# Patient Record
Sex: Female | Born: 1940 | Race: White | Hispanic: No | Smoking: Never smoker
Health system: Southern US, Community
[De-identification: ages and names within clinical notes are randomized; demographics above are authoritative.]

## PROBLEM LIST (undated history)

## (undated) DIAGNOSIS — M1990 Unspecified osteoarthritis, unspecified site: Secondary | ICD-10-CM

## (undated) DIAGNOSIS — H3530 Unspecified macular degeneration: Secondary | ICD-10-CM

## (undated) DIAGNOSIS — E7800 Pure hypercholesterolemia, unspecified: Secondary | ICD-10-CM

## (undated) DIAGNOSIS — E119 Type 2 diabetes mellitus without complications: Secondary | ICD-10-CM

## (undated) DIAGNOSIS — H9190 Unspecified hearing loss, unspecified ear: Secondary | ICD-10-CM

## (undated) DIAGNOSIS — I10 Essential (primary) hypertension: Secondary | ICD-10-CM

## (undated) HISTORY — PX: ORIF ANKLE FRACTURE: SUR919

## (undated) HISTORY — PX: PATELLA FRACTURE SURGERY: SHX735

---

## 2000-12-15 ENCOUNTER — Ambulatory Visit (HOSPITAL_COMMUNITY): Admission: RE | Admit: 2000-12-15 | Discharge: 2000-12-15 | Payer: Self-pay | Admitting: Internal Medicine

## 2006-06-06 ENCOUNTER — Inpatient Hospital Stay (HOSPITAL_COMMUNITY): Admission: EM | Admit: 2006-06-06 | Discharge: 2006-06-11 | Payer: Self-pay | Admitting: Emergency Medicine

## 2006-06-06 ENCOUNTER — Ambulatory Visit: Payer: Self-pay | Admitting: Orthopedic Surgery

## 2006-06-08 ENCOUNTER — Encounter: Payer: Self-pay | Admitting: Orthopedic Surgery

## 2006-06-21 ENCOUNTER — Ambulatory Visit: Payer: Self-pay | Admitting: Orthopedic Surgery

## 2006-07-19 ENCOUNTER — Ambulatory Visit: Payer: Self-pay | Admitting: Orthopedic Surgery

## 2006-08-09 ENCOUNTER — Ambulatory Visit: Payer: Self-pay | Admitting: Orthopedic Surgery

## 2006-08-11 ENCOUNTER — Encounter (HOSPITAL_COMMUNITY): Admission: RE | Admit: 2006-08-11 | Discharge: 2006-09-10 | Payer: Self-pay | Admitting: Orthopedic Surgery

## 2006-09-09 ENCOUNTER — Ambulatory Visit: Payer: Self-pay | Admitting: Orthopedic Surgery

## 2006-09-14 ENCOUNTER — Encounter (HOSPITAL_COMMUNITY): Admission: RE | Admit: 2006-09-14 | Discharge: 2006-10-12 | Payer: Self-pay | Admitting: Orthopedic Surgery

## 2008-05-09 ENCOUNTER — Emergency Department (HOSPITAL_COMMUNITY): Admission: EM | Admit: 2008-05-09 | Discharge: 2008-05-09 | Payer: Self-pay | Admitting: Dentistry

## 2008-05-09 ENCOUNTER — Encounter: Payer: Self-pay | Admitting: Orthopedic Surgery

## 2008-05-09 DIAGNOSIS — E119 Type 2 diabetes mellitus without complications: Secondary | ICD-10-CM | POA: Insufficient documentation

## 2008-05-10 ENCOUNTER — Ambulatory Visit: Payer: Self-pay | Admitting: Orthopedic Surgery

## 2008-05-10 DIAGNOSIS — S82009A Unspecified fracture of unspecified patella, initial encounter for closed fracture: Secondary | ICD-10-CM | POA: Insufficient documentation

## 2008-05-15 ENCOUNTER — Ambulatory Visit: Payer: Self-pay | Admitting: Orthopedic Surgery

## 2008-05-15 ENCOUNTER — Ambulatory Visit (HOSPITAL_COMMUNITY): Admission: RE | Admit: 2008-05-15 | Discharge: 2008-05-15 | Payer: Self-pay | Admitting: Orthopedic Surgery

## 2008-05-16 ENCOUNTER — Encounter: Payer: Self-pay | Admitting: Orthopedic Surgery

## 2008-05-16 ENCOUNTER — Telehealth: Payer: Self-pay | Admitting: Orthopedic Surgery

## 2008-05-17 ENCOUNTER — Ambulatory Visit: Payer: Self-pay | Admitting: Orthopedic Surgery

## 2008-05-24 ENCOUNTER — Ambulatory Visit: Payer: Self-pay | Admitting: Orthopedic Surgery

## 2008-05-29 ENCOUNTER — Telehealth: Payer: Self-pay | Admitting: Orthopedic Surgery

## 2008-05-30 ENCOUNTER — Encounter: Payer: Self-pay | Admitting: Orthopedic Surgery

## 2008-06-01 ENCOUNTER — Telehealth: Payer: Self-pay | Admitting: Orthopedic Surgery

## 2008-06-18 ENCOUNTER — Telehealth: Payer: Self-pay | Admitting: Orthopedic Surgery

## 2008-06-21 ENCOUNTER — Ambulatory Visit: Payer: Self-pay | Admitting: Orthopedic Surgery

## 2008-06-28 ENCOUNTER — Encounter (HOSPITAL_COMMUNITY): Admission: RE | Admit: 2008-06-28 | Discharge: 2008-07-28 | Payer: Self-pay | Admitting: Orthopedic Surgery

## 2008-06-28 ENCOUNTER — Encounter: Payer: Self-pay | Admitting: Orthopedic Surgery

## 2008-07-30 ENCOUNTER — Encounter (HOSPITAL_COMMUNITY): Admission: RE | Admit: 2008-07-30 | Discharge: 2008-08-29 | Payer: Self-pay | Admitting: Orthopedic Surgery

## 2008-08-02 ENCOUNTER — Ambulatory Visit: Payer: Self-pay | Admitting: Orthopedic Surgery

## 2008-08-15 ENCOUNTER — Encounter: Payer: Self-pay | Admitting: Orthopedic Surgery

## 2010-04-22 LAB — GLUCOSE, CAPILLARY
Glucose-Capillary: 113 mg/dL — ABNORMAL HIGH (ref 70–99)
Glucose-Capillary: 157 mg/dL — ABNORMAL HIGH (ref 70–99)

## 2010-04-23 LAB — CBC
MCHC: 34 g/dL (ref 30.0–36.0)
Platelets: 189 10*3/uL (ref 150–400)
RDW: 13.8 % (ref 11.5–15.5)

## 2010-04-23 LAB — BASIC METABOLIC PANEL
BUN: 27 mg/dL — ABNORMAL HIGH (ref 6–23)
Creatinine, Ser: 1.24 mg/dL — ABNORMAL HIGH (ref 0.4–1.2)
GFR calc non Af Amer: 43 mL/min — ABNORMAL LOW (ref >60)
Glucose, Bld: 154 mg/dL — ABNORMAL HIGH (ref 70–99)

## 2010-05-27 NOTE — Op Note (Signed)
Debra Grimes, Debra Grimes              ACCOUNT NO.:  [id number]   MEDICAL RECORD NO.:  [id number]          PATIENT TYPE:  AMB   LOCATION:  DAY                           FACILITY:  [facility]   PHYSICIAN:  Vickki Hearing, M.D.DATE OF BIRTH:  Jun 20, 1940   DATE OF PROCEDURE:  DATE OF DISCHARGE:  05/15/2008                               OPERATIVE REPORT   PREOPERATIVE DIAGNOSIS:  Closed right patellar fracture.   POSTOPERATIVE DIAGNOSIS:  Closed right patellar fracture.   PROCEDURE:  Open treatment internal fixation, right patellar with  tension band technique.   SURGEON:  Vickki Hearing, M.D.   ASSISTANT:  Normangee Nation.   There were no complications.   ANESTHETIC:  Spinal.   OPERATIVE FINDINGS:  Transverse patellar fracture involving the upper  two-thirds and lower one-third of the patella.  There was a  characteristic retinacular tearing on medial lateral sides.   After site marking and the patient identification, chart was updated.  The patient was taken to surgery, had spinal anesthetic, was placed  supine on the operating table with a tourniquet on her right thigh.  After surgical prep and drape, midline incision was made.  Subcutaneous  tissue was divided.  A full thickness medial and lateral skin flaps were  made.  The fracture was identified.  The joint was opened and irrigated,  and removed debris and clot.  The fracture was debrided and then two K  wires were passed retrograde and then antegrade to reduce the fracture  and then a tension band technique was used with an 18 gauge wire which  secured the fracture anatomically as indicated on AP and lateral x-rays.   Retinacular area was repaired with #1 Bralon suture.   Knee was taken through a range of motion and she had easily 0-90 degrees  of flexion with no tension on the repair.  We actually had increased  apposition of the fracture fragments with flexion of the knee.   The knee was thoroughly irrigated and  closed in layered fashion with 0,  2-0 Monocryl and staples.  Injected a total of 60 mL of Marcaine both in  the joint and in the subcutaneous tissue for postop pain relief.   The patient was taken to recovery room in stable condition.   The patient will be full weightbearing and locked up in brace when we  start her therapy.  She can range 0-90 without any problems.  We expect  bracing for 12 weeks.  Follow up on the 6th for dressing change.      Vickki Hearing, M.D.  Electronically Signed     SEH/MEDQ  D:  05/17/2008  T:  05/18/2008  Job:  [id number]

## 2010-05-27 NOTE — Op Note (Signed)
NAMEAVIVA, WOLFER              ACCOUNT NO.:  [id number]   MEDICAL RECORD NO.:  [id number]          PATIENT TYPE:  INP   LOCATION:  A332                          FACILITY:  [facility]   PHYSICIAN:  Vickki Hearing, M.D.DATE OF BIRTH:  03-26-1940   DATE OF PROCEDURE:  06/08/2006  DATE OF DISCHARGE:                               OPERATIVE REPORT   INDICATIONS FOR PROCEDURE:  [name] was admitted May 25 after  falling sustaining a trimalleolar fracture of the left ankle that was  closed.  She was placed in a splint.  She was treated with ice and  elevation for 48 hours to allow swelling to go down and then brought to  surgery for internal fixation.   PREOPERATIVE DIAGNOSIS:  Closed left trimalleolar ankle fracture.   POSTOPERATIVE DIAGNOSIS:  Closed left trimalleolar ankle fracture.   PROCEDURE:  Open treatment and internal fixation, left ankle, with four  K-wires medially, a seven hole plate laterally, one of the screws was a  locking screw.   OPERATIVE FINDINGS:  The lateral malleolus was a standard oblique  lateral malleolus fracture, Weber B type. The medial fracture was  comminuted in several planes. There was a transverse fracture at the  level of the joint.  The distal portion of this fracture was split  sagittally. The proximal fragment extended in the coronal plane and went  up the back of the tibia approximately 3 mm and created a large  posterior malleolar fragment.   SURGEON:  Vickki Hearing, M.D.   ASSISTANT:   ANESTHESIA:  Spinal.   BLOOD LOSS:  Minimal.   COUNTS:  Correct.   DISPOSITION:  The patient went to recovery in stable condition.   DESCRIPTION OF PROCEDURE:  This patient was identified as Debra Grimes, the left ankle marked for surgery, countersigned by the  surgeon.  History and physical updated following inpatient rules.  The  patient was given antibiotics, vancomycin and spinal anesthetic.  The  leg was prepped and draped with  sterile technique.  The time out was  procedure was completed.  The left ankle confirmed as the surgical site.  The tourniquet was elevated to 300 mmHg, the limb was exsanguinated with  a 4 inch Esmarch prior to elevation of the tourniquet.   A longitudinal incision was made over the fibula and taken down to bone  to create a full thickness flap. Open reduction was performed by  irrigating the fracture site using clamps and manual reduction to reduce  the fracture.  This was confirmed by C-arm.  Then, a seven hole plate  was contoured and applied to the lateral malleolus.  Four screws were  placed and then radiograph confirmed the mortise reduction and the  lateral malleolar fragment was still displaced. We put in the remaining  three screws and packed that wound with a sterile moist dressing.   We opened the medial side by extending the incision more longitudinally.  We divided the subcutaneous tissue down to bone.  We opened up the  posterior tibial tendon sheath. Subperiosteal dissection was carried out  behind the tibia.  The fracture was manually reduced and held with  clamps and K-wires. 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 X-rays did confirm that the medial and posterior malleolar  fragment were reduced. Both wounds were irrigated and closed with 2-0  Monocryl and staples medially, 0 Monocryl and staples laterally. 10 mL  Marcaine with epinephrine injected medially, 20 mL laterally.  A  posterior splint was applied with dressings.  The tourniquet was  released.  The toes were viable.   POSTOP PLAN:  Vancomycin for 24 hours. Non weight bearing for at least  eight weeks, more if necessary depending on fracture healing. The  patient should be started on osteoporotic medication along with the  vitamin D and calcium that she already takes.      Vickki Hearing, M.D.   Electronically Signed     SEH/MEDQ  D:  06/08/2006  T:  06/08/2006  Job:  [id number]

## 2010-05-27 NOTE — H&P (Signed)
Debra Grimes, Debra Grimes              ACCOUNT NO.:  [id number]   MEDICAL RECORD NO.:  [id number]          PATIENT TYPE:  INP   LOCATION:  A332                          FACILITY:  [facility]   PHYSICIAN:  Vickki Hearing, M.D.DATE OF BIRTH:  06/08/1940   DATE OF ADMISSION:  06/06/2006  DATE OF DISCHARGE:  LH                              HISTORY & PHYSICAL   This is a 70 year old female who injured her left ankle after a fall.  She sustained a closed trimalleolar ankle fracture.  Evaluation was  initially done in the emergency room.  The patient lives at home with  her husband.  She is ambulatory with no assistive devices.  She has only  had 1 previous surgery, female surgery, indicating a hysterectomy.  She is followed by Dr. Renard Matter.   She is comfortable today after pain medication and application of  splint.  She is currently on bedrest with ice and elevation to promote  decrease in swelling, to allow surgery for Tuesday, the 27th.   She does have diabetes.  She does not smoke or drink.  She says that she  is hearing-impaired.  She is allergic to PENICILLIN.  Medicines are  Metformin and Avandamet both indicated at once a day.  Will we will  review this with her.  Review of systems all negative.   VITAL SIGNS:  Temperature 98,  67 pulse, respiratory rate 20, blood  pressure 145/66.  Current room air sat 99% on room air.  CBG 179 and 116  last 2 checks.  GENERAL:  She is awake, alert and oriented times 3.  Appearance is  normal.  HEENT: Without any significant changes.  NECK:  Supple.  Lymph nodes are benign.  EXTREMITIES:  Normal neurovascular exam with good pulses and perfusion.  The deformity mentioned is more swelling as the joint is subluxated  posteriorly with normal mortise x-ray and normal AP x-ray other than the  fractures.  On the lateral film there is subluxation with a large  posterior malleolar fragment, however, this is minor.  Skin integrity is  normal.  The patient  is awake and alert was pleasant.  Extremities other  than the injured are without deformity have good strength, muscle tone,  no laxity.  No joint contractures, subluxation, atrophy or tremor.   Radiographs included chest film and x-rays of the ankle.  Again there is  a trimalleolar fracture on the mortise view.  The mortise is actually  intact.  There is no medial space widening.  On the lateral film there  is subluxation of the talus in relation to the tibia posteriorly but  with the large posterior fragment subluxated with the talus.  There is  some anterior increased space on the lateral view.  There is medial and  lateral malleolar fractures as noted.   IMPRESSION:  Closed trimalleolar fracture.   PLAN:  Open treatment internal fixation.  The posterior malleolar  fragment will require fixation which may require additional posterior  incision.   Informed consent process has been completed.  The patient is advised  that she needs to be compliant,  that she is at risk for amputation  secondary to diabetes, also increased risk for infection.  Also, will  need to be nonweightbearing.  Healing time should be somewhere 12 to 16  weeks   Surgery is scheduled for Tuesday, open treatment internal fixation of  left trimalleolar ankle fracture.      Vickki Hearing, M.D.  Electronically Signed     SEH/MEDQ  D:  06/07/2006  T:  06/07/2006  Job:  [id number]

## 2010-05-27 NOTE — Group Therapy Note (Signed)
Debra Grimes, Debra Grimes              ACCOUNT NO.:  [id number]   MEDICAL RECORD NO.:  [id number]          PATIENT TYPE:  INP   LOCATION:  A332                          FACILITY:  [facility]   PHYSICIAN:  Vickki Hearing, M.D.DATE OF BIRTH:  13-Oct-1940   DATE OF PROCEDURE:  DATE OF DISCHARGE:                                 PROGRESS NOTE   She is postop day 2 now.  She had a left ankle trimalleolar fracture  treated with open treatment, internal fixation.  She had a temperature  of 101.2 max, pulse 81, respiratory rate 18, blood pressure 125/76.  Her  room air sats dropped to 92 and 87; had been 96-97.  CBGs, she is  diabetic, are 170, 210 and 153, the last 3.  I&O looks fine.  Her pain  levels are 4-6 in the last 3 recordings, on Vicodin and morphine.  She  is nonweightbearing.  She is otherwise bed to chair, nonweightbearing  with a walker when she ambulates, and we should be able to arrange  discharge very soon.      Vickki Hearing, M.D.  Electronically Signed     SEH/MEDQ  D:  06/10/2006  T:  06/10/2006  Job:  [id number]

## 2010-05-27 NOTE — Group Therapy Note (Signed)
NAMENARELY, NOBLES              ACCOUNT NO.:  [id number]   MEDICAL RECORD NO.:  [id number]          PATIENT TYPE:  INP   LOCATION:  A332                          FACILITY:  [facility]   PHYSICIAN:  Vickki Hearing, M.D.DATE OF BIRTH:  1940-06-05   DATE OF PROCEDURE:  06/11/2006  DATE OF DISCHARGE:                                 PROGRESS NOTE   The patient is postop from a left ankle fracture open treatment internal  fixation.  It was a trimalleolar fracture.  Her temperature is down to  100.8 max.  Today it is 98.6.  Vitals are stable.  Glucose is stable.  The patient can be discharged home today non-weightbearing on the  operative limb, left leg.  Follow-up arranged for June 9.      Vickki Hearing, M.D.  Electronically Signed     SEH/MEDQ  D:  06/11/2006  T:  06/11/2006  Job:  [id number]

## 2010-05-27 NOTE — Discharge Summary (Signed)
Debra, Grimes              ACCOUNT NO.:  [id number]   MEDICAL RECORD NO.:  [id number]          PATIENT TYPE:  INP   LOCATION:  A332                          FACILITY:  [facility]   PHYSICIAN:  Vickki Hearing, M.D.DATE OF BIRTH:  10-01-1940   DATE OF ADMISSION:  06/06/2006  DATE OF DISCHARGE:  05/30/2008LH                               DISCHARGE SUMMARY   ADMITTING DIAGNOSIS:  Closed left trimalleolar fracture of the ankle.   DISCHARGING DIAGNOSIS:  Closed left trimalleolar fracture of the ankle.   PROCEDURE:  Open treatment internal fixation left ankle.   SURGEON:  Vickki Hearing, M.D.   ANESTHETIC:  Spinal.   OPERATIVE FINDINGS:  Lateral malleolus fracture, medial and posterior  malleolar fractures.  We will use medial and lateral fixation.  The  medial incision was made longer and more posterior to reach the  posterior malleolar fragment which was reduced openly.  Only K-wires  were used on the medial side due to severe comminution.   HOSPITAL COURSE AND HISTORY:  The patient fell at home on the 25th; she  was admitted AND had surgery on the 27th after 48 hours of ice and  elevation.  She tolerated physical therapy; and was allowed discharge on  the 30th.   DISCHARGE MEDICATIONS:  1. Zetia 10 mg daily.  2. Glucophage 500 mg daily.  3. Avandia 4 mg p.o. daily.  4. Zocor 40 mg p.o. daily.  5. Vicodin 1-2 tablets q.4-6 h. P.r.n.  6. Note -- the patient took a combination medication called Avandamet      which she will resume.   FOLLOWUP:  She is to follow up, again, on June 9 for wound evaluation,  staple removal, and x-ray.  She is to be nonweightbearing on the left  leg.   DISPOSITION:  Home.   CONDITION:  Improved.      Vickki Hearing, M.D.  Electronically Signed     SEH/MEDQ  D:  06/11/2006  T:  06/11/2006  Job:  [id number]

## 2010-05-30 NOTE — Op Note (Signed)
[facility]  Patient:    Debra Grimes, Debra Grimes Visit Number: [id number] MRN: [id number]          Service Type: END Location: DAY Attending Physician:  Jonathon Bellows Dictated by:   Roetta Sessions, M.D. Proc. Date: 12/15/00 Admit Date:  12/15/2000   CC:         Butch Penny, M.D.   Operative Report  PROCEDURE:  Esophagogastroduodenoscopy with biopsy.  ENDOSCOPIST:  Roetta Sessions, M.D.  INDICATION FOR PROCEDURE:  Patient is a 70 year old lady who developed some epigastric pain and mild odynophagia associated with some "blood coming up out of her throat" yesterday.  She was seen by Dr. Butch Penny and he asked me to see her.  This lady has not had any problems predating this episode.  No chronic symptoms of reflux, abdominal pain, odynophagia, nausea or vomiting, melena, rectal bleeding, etc.  She has not had her lower GI tract evaluated for colorectal cancer screening as of yet.  She does take Aleve p.r.n., Zetia and Lipitor.  She is not sure whether or not she took a pill and laid down right afterwards. She has never had similar symptoms previously.  EGD is now being done to further evaluate her complaint.  This approach has been discussed with Debra Grimes.  Potential risks, benefits and alternatives have been reviewed, questions answered and she is agreeable.  Please see my handwritten H&P for more information.  DESCRIPTION OF PROCEDURE:  O2 saturation, blood pressure, pulse and respirations were monitored throughout the entire procedure.  Conscious sedation with Versed 2 mg IV, Demerol 50 mg IV, Cetacaine spray for topical oropharyngeal anesthesia.  INSTRUMENT:  Olympus gastroscope.  FINDINGS:  Examination of the tubular esophagus revealed two 3-mm distal esophageal ulcers which were opposite one another.  They were "kissing" in location.  There was also a small distal esophageal erosion.  There was no evidence of neoplasia, Barretts  esophagus, ring or stricture.  EG junction was easily traversed.  Stomach:  The gastric cavity was empty and insufflated well with air. Thorough examination of the gastric mucosa including a retroflexed view of the proximal stomach and esophagogastric junction was undertaken.  Patient was noted to have a 5-mm fundal polyp.  The remainder of the gastric mucosa appeared normal.  Pylorus was patent and easily traversed.  Duodenum:  The bulb and second portion appeared normal.  THERAPEUTIC/DIAGNOSTIC MANEUVERS PERFORMED: 1. Antral biopsies x 2 for CLOtesting were obtained. 2. The fundal polyp was cold biopsied/removed.  The patient tolerated the procedure well and was reactive at endoscopy.  IMPRESSION: 1. "Kissing" distal esophageal ulcers most consistent with transient    co-impaction, although gastroesophageal reflux-induced injury is not    absolutely excluded.  Remainder of esophageal mucosa appeared normal. 2. Small hiatal hernia fundal polyp cold biopsied/removed. 3. Remainder of stomach and duodenum through the second portion appeared    normal, status post antral biopsy and CLOtesting.  RECOMMENDATIONS: 1. Begin Prilosec 20 mg orally daily. 2. Carafate 1 g slurries q.i.d. for the next five days. 3. Patient is admonished to take her pills with a full glass of water and stay    upright for 30 minutes afterwards.  Hopefully, her symptoms will subside    over the next few days. 4. Followup appointment with [place] in a month. 5. Patient is overdue for colorectal cancer screening and will discuss    colonoscopy when she returns in one month. Dictated by:   Roetta Sessions, M.D. Attending Physician:  Jonathon Bellows  DD:  12/15/00 TD:  12/15/00 Job: [id number]

## 2011-12-08 ENCOUNTER — Other Ambulatory Visit (HOSPITAL_COMMUNITY): Payer: Self-pay | Admitting: Family Medicine

## 2011-12-08 DIAGNOSIS — M810 Age-related osteoporosis without current pathological fracture: Secondary | ICD-10-CM

## 2011-12-15 ENCOUNTER — Ambulatory Visit (HOSPITAL_COMMUNITY)
Admission: RE | Admit: 2011-12-15 | Discharge: 2011-12-15 | Disposition: A | Discharge status: Home / Self Care | Payer: Medicare Other | Source: Ambulatory Visit | Attending: Family Medicine | Admitting: Family Medicine

## 2011-12-15 ENCOUNTER — Other Ambulatory Visit (HOSPITAL_COMMUNITY): Payer: Self-pay | Admitting: Family Medicine

## 2011-12-15 DIAGNOSIS — M899 Disorder of bone, unspecified: Secondary | ICD-10-CM | POA: Insufficient documentation

## 2011-12-15 DIAGNOSIS — R05 Cough: Secondary | ICD-10-CM

## 2011-12-15 DIAGNOSIS — M810 Age-related osteoporosis without current pathological fracture: Secondary | ICD-10-CM

## 2013-08-22 ENCOUNTER — Ambulatory Visit: Payer: Medicare Other | Admitting: Orthopedic Surgery

## 2013-11-21 ENCOUNTER — Other Ambulatory Visit (HOSPITAL_COMMUNITY): Payer: Self-pay | Admitting: Family Medicine

## 2013-11-21 DIAGNOSIS — Z139 Encounter for screening, unspecified: Secondary | ICD-10-CM

## 2013-11-21 DIAGNOSIS — M810 Age-related osteoporosis without current pathological fracture: Secondary | ICD-10-CM

## 2013-11-29 ENCOUNTER — Ambulatory Visit (HOSPITAL_COMMUNITY)
Admission: RE | Admit: 2013-11-29 | Discharge: 2013-11-29 | Disposition: A | Discharge status: Home / Self Care | Payer: Medicare Other | Source: Ambulatory Visit | Attending: Family Medicine | Admitting: Family Medicine

## 2013-11-29 DIAGNOSIS — M810 Age-related osteoporosis without current pathological fracture: Secondary | ICD-10-CM | POA: Diagnosis not present

## 2013-11-29 DIAGNOSIS — Z139 Encounter for screening, unspecified: Secondary | ICD-10-CM

## 2013-11-29 DIAGNOSIS — Z780 Asymptomatic menopausal state: Secondary | ICD-10-CM | POA: Diagnosis not present

## 2013-11-29 DIAGNOSIS — M8580 Other specified disorders of bone density and structure, unspecified site: Secondary | ICD-10-CM | POA: Diagnosis not present

## 2013-11-29 DIAGNOSIS — Z1231 Encounter for screening mammogram for malignant neoplasm of breast: Secondary | ICD-10-CM | POA: Diagnosis present

## 2013-11-29 DIAGNOSIS — Z09 Encounter for follow-up examination after completed treatment for conditions other than malignant neoplasm: Secondary | ICD-10-CM | POA: Insufficient documentation

## 2014-08-20 NOTE — Patient Instructions (Signed)
Your procedure is scheduled on: 08/27/2014  Report to [facility] at  615  AM.  Call this number if you have problems the morning of surgery: [phone]   Do not eat food or drink liquids :After Midnight.      Take these medicines the morning of surgery with A SIP OF WATER: none   Do not wear jewelry, make-up or nail polish.  Do not wear lotions, powders, or perfumes.   Do not shave 48 hours prior to surgery.  Do not bring valuables to the hospital.  Contacts, dentures or bridgework may not be worn into surgery.  Leave suitcase in the car. After surgery it may be brought to your room.  For patients admitted to the hospital, checkout time is 11:00 AM the day of discharge.   Patients discharged the day of surgery will not be allowed to drive home.  :     Please read over the following fact sheets that you were given: Coughing and Deep Breathing, Surgical Site Infection Prevention, Anesthesia Post-op Instructions and Care and Recovery After Surgery    Cataract A cataract is a clouding of the lens of the eye. When a lens becomes cloudy, vision is reduced based on the degree and nature of the clouding. Many cataracts reduce vision to some degree. Some cataracts make people more near-sighted as they develop. Other cataracts increase glare. Cataracts that are ignored and become worse can sometimes look white. The white color can be seen through the pupil. CAUSES   Aging. However, cataracts may occur at any age, even in newborns.   Certain drugs.   Trauma to the eye.   Certain diseases such as diabetes.   Specific eye diseases such as chronic inflammation inside the eye or a sudden attack of a rare form of glaucoma.   Inherited or acquired medical problems.  SYMPTOMS   Gradual, progressive drop in vision in the affected eye.   Severe, rapid visual loss. This most often happens when trauma is the cause.  DIAGNOSIS  To detect a cataract, an eye doctor examines the lens. Cataracts are  best diagnosed with an exam of the eyes with the pupils enlarged (dilated) by drops.  TREATMENT  For an early cataract, vision may improve by using different eyeglasses or stronger lighting. If that does not help your vision, surgery is the only effective treatment. A cataract needs to be surgically removed when vision loss interferes with your everyday activities, such as driving, reading, or watching TV. A cataract may also have to be removed if it prevents examination or treatment of another eye problem. Surgery removes the cloudy lens and usually replaces it with a substitute lens (intraocular lens, IOL).  At a time when both you and your doctor agree, the cataract will be surgically removed. If you have cataracts in both eyes, only one is usually removed at a time. This allows the operated eye to heal and be out of danger from any possible problems after surgery (such as infection or poor wound healing). In rare cases, a cataract may be doing damage to your eye. In these cases, your caregiver may advise surgical removal right away. The vast majority of people who have cataract surgery have better vision afterward. HOME CARE INSTRUCTIONS  If you are not planning surgery, you may be asked to do the following:  Use different eyeglasses.   Use stronger or brighter lighting.   Ask your eye doctor about reducing your medicine dose or changing medicines if  it is thought that a medicine caused your cataract. Changing medicines does not make the cataract go away on its own.   Become familiar with your surroundings. Poor vision can lead to injury. Avoid bumping into things on the affected side. You are at a higher risk for tripping or falling.   Exercise extreme care when driving or operating machinery.   Wear sunglasses if you are sensitive to bright light or experiencing problems with glare.  SEEK IMMEDIATE MEDICAL CARE IF:   You have a worsening or sudden vision loss.   You notice redness,  swelling, or increasing pain in the eye.   You have a fever.  Document Released: 12/29/2004 Document Revised: 12/18/2010 Document Reviewed: 08/22/2010 [facility] Patient Information 2012 [place].PATIENT INSTRUCTIONS POST-ANESTHESIA  IMMEDIATELY FOLLOWING SURGERY:  Do not drive or operate machinery for the first twenty four hours after surgery.  Do not make any important decisions for twenty four hours after surgery or while taking narcotic pain medications or sedatives.  If you develop intractable nausea and vomiting or a severe headache please notify your doctor immediately.  FOLLOW-UP:  Please make an appointment with your surgeon as instructed. You do not need to follow up with anesthesia unless specifically instructed to do so.  WOUND CARE INSTRUCTIONS (if applicable):  Keep a dry clean dressing on the anesthesia/puncture wound site if there is drainage.  Once the wound has quit draining you may leave it open to air.  Generally you should leave the bandage intact for twenty four hours unless there is drainage.  If the epidural site drains for more than 36-48 hours please call the anesthesia department.  QUESTIONS?:  Please feel free to call your physician or the hospital operator if you have any questions, and they will be happy to assist you.

## 2014-08-21 ENCOUNTER — Encounter (HOSPITAL_COMMUNITY)
Admission: RE | Admit: 2014-08-21 | Discharge: 2014-08-21 | Disposition: A | Discharge status: Home / Self Care | Payer: Medicare Other | Source: Ambulatory Visit | Attending: Ophthalmology | Admitting: Ophthalmology

## 2014-08-21 ENCOUNTER — Encounter (HOSPITAL_COMMUNITY): Payer: Self-pay

## 2014-08-21 ENCOUNTER — Other Ambulatory Visit: Payer: Self-pay

## 2014-08-21 DIAGNOSIS — Z01818 Encounter for other preprocedural examination: Secondary | ICD-10-CM | POA: Diagnosis not present

## 2014-08-21 DIAGNOSIS — H2511 Age-related nuclear cataract, right eye: Secondary | ICD-10-CM | POA: Insufficient documentation

## 2014-08-21 HISTORY — DX: Type 2 diabetes mellitus without complications: E11.9

## 2014-08-21 HISTORY — DX: Pure hypercholesterolemia, unspecified: E78.00

## 2014-08-21 HISTORY — DX: Essential (primary) hypertension: I10

## 2014-08-21 HISTORY — DX: Unspecified osteoarthritis, unspecified site: M19.90

## 2014-08-21 LAB — CBC WITH DIFFERENTIAL/PLATELET
BASOS ABS: 0 10*3/uL (ref 0.0–0.1)
Basophils Relative: 0 % (ref 0–1)
EOS ABS: 0.2 10*3/uL (ref 0.0–0.7)
Eosinophils Relative: 2 % (ref 0–5)
HEMATOCRIT: 36.5 % (ref 36.0–46.0)
HEMOGLOBIN: 12.1 g/dL (ref 12.0–15.0)
LYMPHS PCT: 23 % (ref 12–46)
Lymphs Abs: 1.7 10*3/uL (ref 0.7–4.0)
MCH: 30.1 pg (ref 26.0–34.0)
MCHC: 33.2 g/dL (ref 30.0–36.0)
MCV: 90.8 fL (ref 78.0–100.0)
Monocytes Absolute: 0.5 10*3/uL (ref 0.1–1.0)
Monocytes Relative: 7 % (ref 3–12)
Neutro Abs: 5.2 10*3/uL (ref 1.7–7.7)
Neutrophils Relative %: 68 % (ref 43–77)
PLATELETS: 254 10*3/uL (ref 150–400)
RBC: 4.02 MIL/uL (ref 3.87–5.11)
RDW: 13 % (ref 11.5–15.5)
WBC: 7.6 10*3/uL (ref 4.0–10.5)

## 2014-08-21 LAB — BASIC METABOLIC PANEL
Anion gap: 10 (ref 5–15)
BUN: 13 mg/dL (ref 6–20)
CO2: 27 mmol/L (ref 22–32)
Calcium: 10.3 mg/dL (ref 8.9–10.3)
Chloride: 102 mmol/L (ref 101–111)
Creatinine, Ser: 0.82 mg/dL (ref 0.44–1.00)
GFR calc Af Amer: >60 mL/min (ref >60)
Glucose, Bld: 111 mg/dL — ABNORMAL HIGH (ref 65–99)
POTASSIUM: 5.2 mmol/L — AB (ref 3.5–5.1)
Sodium: 139 mmol/L (ref 135–145)

## 2014-08-21 NOTE — Pre-Procedure Instructions (Signed)
Patient given information to sign up for my chart at home. 

## 2014-08-24 MED ORDER — CYCLOPENTOLATE-PHENYLEPHRINE OP SOLN OPTIME - NO CHARGE
OPHTHALMIC | Status: AC
  Filled 2014-08-24: qty 2

## 2014-08-24 MED ORDER — LIDOCAINE HCL 3.5 % OP GEL
OPHTHALMIC | Status: AC
  Filled 2014-08-24: qty 1

## 2014-08-24 MED ORDER — TETRACAINE HCL 0.5 % OP SOLN
OPHTHALMIC | Status: AC
  Filled 2014-08-24: qty 2

## 2014-08-27 ENCOUNTER — Encounter (HOSPITAL_COMMUNITY)
Admission: RE | Disposition: A | Discharge status: Home / Self Care | Payer: Self-pay | Source: Ambulatory Visit | Attending: Ophthalmology

## 2014-08-27 ENCOUNTER — Ambulatory Visit (HOSPITAL_COMMUNITY)
Admission: RE | Admit: 2014-08-27 | Discharge: 2014-08-27 | Disposition: A | Discharge status: Home / Self Care | Payer: Medicare Other | Source: Ambulatory Visit | Attending: Ophthalmology | Admitting: Ophthalmology

## 2014-08-27 ENCOUNTER — Encounter (HOSPITAL_COMMUNITY): Payer: Self-pay | Admitting: *Deleted

## 2014-08-27 ENCOUNTER — Ambulatory Visit (HOSPITAL_COMMUNITY): Payer: Medicare Other | Admitting: Anesthesiology

## 2014-08-27 DIAGNOSIS — I10 Essential (primary) hypertension: Secondary | ICD-10-CM | POA: Diagnosis not present

## 2014-08-27 DIAGNOSIS — H2511 Age-related nuclear cataract, right eye: Secondary | ICD-10-CM | POA: Insufficient documentation

## 2014-08-27 DIAGNOSIS — E119 Type 2 diabetes mellitus without complications: Secondary | ICD-10-CM | POA: Insufficient documentation

## 2014-08-27 DIAGNOSIS — Z79899 Other long term (current) drug therapy: Secondary | ICD-10-CM | POA: Insufficient documentation

## 2014-08-27 DIAGNOSIS — Z7982 Long term (current) use of aspirin: Secondary | ICD-10-CM | POA: Diagnosis not present

## 2014-08-27 HISTORY — PX: CATARACT EXTRACTION W/PHACO: SHX586

## 2014-08-27 LAB — GLUCOSE, CAPILLARY: Glucose-Capillary: 158 mg/dL — ABNORMAL HIGH (ref 65–99)

## 2014-08-27 SURGERY — PHACOEMULSIFICATION, CATARACT, WITH IOL INSERTION
Anesthesia: Monitor Anesthesia Care | Site: Eye | Laterality: Right

## 2014-08-27 MED ORDER — FENTANYL CITRATE (PF) 100 MCG/2ML IJ SOLN
INTRAMUSCULAR | Status: AC
  Filled 2014-08-27: qty 2

## 2014-08-27 MED ORDER — MIDAZOLAM HCL 2 MG/2ML IJ SOLN
1.0000 mg | INTRAMUSCULAR | Status: DC | PRN
Start: 2014-08-27 — End: 2014-08-27
  Administered 2014-08-27 (×2): 2 mg via INTRAVENOUS
  Filled 2014-08-27: qty 2

## 2014-08-27 MED ORDER — FENTANYL CITRATE (PF) 100 MCG/2ML IJ SOLN
25.0000 ug | Freq: Once | INTRAMUSCULAR | Status: AC
  Administered 2014-08-27: 25 ug via INTRAVENOUS

## 2014-08-27 MED ORDER — MIDAZOLAM HCL 2 MG/2ML IJ SOLN
INTRAMUSCULAR | Status: AC
  Filled 2014-08-27: qty 2

## 2014-08-27 MED ORDER — ONDANSETRON HCL 4 MG/2ML IJ SOLN: 4.0000 mg | Freq: Once | INTRAMUSCULAR | Status: DC | PRN

## 2014-08-27 MED ORDER — BSS IO SOLN
INTRAOCULAR | Status: DC | PRN
  Administered 2014-08-27: 15 mL

## 2014-08-27 MED ORDER — LIDOCAINE HCL 3.5 % OP GEL
OPHTHALMIC | Status: DC | PRN
  Administered 2014-08-27: 1 via OPHTHALMIC

## 2014-08-27 MED ORDER — NA HYALUR & NA CHOND-NA HYALUR 0.55-0.5 ML IO KIT
PACK | INTRAOCULAR | Status: DC | PRN
Start: 2014-08-27 — End: 2014-08-27
  Administered 2014-08-27: 1 via OPHTHALMIC

## 2014-08-27 MED ORDER — POVIDONE-IODINE 5 % OP SOLN
OPHTHALMIC | Status: DC | PRN
Start: 2014-08-27 — End: 2014-08-27
  Administered 2014-08-27: 1 via OPHTHALMIC

## 2014-08-27 MED ORDER — PHENYLEPHRINE-KETOROLAC 1-0.3 % IO SOLN
INTRAOCULAR | Status: AC
  Filled 2014-08-27: qty 4

## 2014-08-27 MED ORDER — LIDOCAINE HCL 3.5 % OP GEL: 1.0000 "application " | Freq: Once | OPHTHALMIC | Status: DC

## 2014-08-27 MED ORDER — LACTATED RINGERS IV SOLN
INTRAVENOUS | Status: DC
  Administered 2014-08-27: 07:00:00 via INTRAVENOUS

## 2014-08-27 MED ORDER — PHENYLEPHRINE-KETOROLAC 1-0.3 % IO SOLN
INTRAOCULAR | Status: DC | PRN
  Administered 2014-08-27: 500 mL via OPHTHALMIC

## 2014-08-27 MED ORDER — TETRACAINE HCL 0.5 % OP SOLN
1.0000 [drp] | OPHTHALMIC | Status: AC
  Administered 2014-08-27 (×3): 1 [drp] via OPHTHALMIC

## 2014-08-27 MED ORDER — FENTANYL CITRATE (PF) 100 MCG/2ML IJ SOLN: 25.0000 ug | INTRAMUSCULAR | Status: DC | PRN

## 2014-08-27 MED ORDER — CYCLOPENTOLATE-PHENYLEPHRINE 0.2-1 % OP SOLN
1.0000 [drp] | OPHTHALMIC | Status: AC
  Administered 2014-08-27 (×3): 1 [drp] via OPHTHALMIC

## 2014-08-27 MED ORDER — TETRACAINE 0.5 % OP SOLN OPTIME - NO CHARGE
OPHTHALMIC | Status: DC | PRN
  Administered 2014-08-27: 1 [drp] via OPHTHALMIC

## 2014-08-27 SURGICAL SUPPLY — 9 items
CLOTH BEACON ORANGE TIMEOUT ST (SAFETY) ×2 IMPLANT
GLOVE BIOGEL PI IND STRL 7.0 (GLOVE) IMPLANT
GLOVE BIOGEL PI INDICATOR 7.0 (GLOVE) ×2
GLOVE EXAM NITRILE MD LF STRL (GLOVE) ×2 IMPLANT
GLOVE SURG SS PI 7.5 STRL IVOR (GLOVE) ×2 IMPLANT
INST SET CATARACT ~~LOC~~ (KITS) ×3 IMPLANT
LENS ALC ACRYL/TECN (Ophthalmic Related) ×3 IMPLANT
PAD ARMBOARD 7.5X6 YLW CONV (MISCELLANEOUS) ×2 IMPLANT
WATER STERILE IRR 250ML POUR (IV SOLUTION) ×2 IMPLANT

## 2014-08-27 NOTE — Transfer of Care (Signed)
Immediate Anesthesia Transfer of Care Note  Patient: Debra Grimes  Procedure(s) Performed: Procedure(s) with comments: CATARACT EXTRACTION PHACO AND INTRAOCULAR LENS PLACEMENT (IOC) (Right) - CDE: 33.79  Patient Location: Short Stay  Anesthesia Type:MAC  Level of Consciousness: awake, alert  and oriented  Airway & Oxygen Therapy: Patient Spontanous Breathing  Post-op Assessment: Report given to RN  Post vital signs: Reviewed  Last Vitals:  Filed Vitals:   08/27/14 0730  BP: 173/81  Temp: 37 C  Resp: [id number]    Complications: No apparent anesthesia complications

## 2014-08-27 NOTE — Discharge Instructions (Signed)

## 2014-08-27 NOTE — Anesthesia Preprocedure Evaluation (Signed)
Anesthesia Evaluation  Patient identified by MRN, date of birth, ID band Patient awake    Reviewed: Allergy & Precautions, NPO status , Patient's Chart, lab work & pertinent test results  Airway Mallampati: II  TM Distance: >3 FB     Dental  (+) Teeth Intact   Pulmonary neg pulmonary ROS,    breath sounds clear to auscultation       Cardiovascular hypertension, Pt. on medications  Rhythm:Regular Rate:Normal     Neuro/Psych    GI/Hepatic negative GI ROS,   Endo/Other  diabetes, Type 2, Oral Hypoglycemic Agents  Renal/GU      Musculoskeletal  (+) Arthritis ,   Abdominal   Peds  Hematology   Anesthesia Other Findings   Reproductive/Obstetrics                             Anesthesia Physical Anesthesia Plan  ASA: III  Anesthesia Plan: MAC   Post-op Pain Management:    Induction: Intravenous  Airway Management Planned: Nasal Cannula  Additional Equipment:   Intra-op Plan:   Post-operative Plan:   Informed Consent: I have reviewed the patients History and Physical, chart, labs and discussed the procedure including the risks, benefits and alternatives for the proposed anesthesia with the patient or authorized representative who has indicated his/her understanding and acceptance.     Plan Discussed with:   Anesthesia Plan Comments:         Anesthesia Quick Evaluation  

## 2014-08-27 NOTE — Anesthesia Postprocedure Evaluation (Signed)
  Anesthesia Post-op Note  Patient: Debra Grimes  Procedure(s) Performed: Procedure(s) with comments: CATARACT EXTRACTION PHACO AND INTRAOCULAR LENS PLACEMENT (IOC) (Right) - CDE: 33.79  Patient Location: Short Stay  Anesthesia Type:MAC  Level of Consciousness: awake, alert  and oriented  Airway and Oxygen Therapy: Patient Spontanous Breathing  Post-op Pain: none  Post-op Assessment: Post-op Vital signs reviewed, Patient's Cardiovascular Status Stable, Respiratory Function Stable, Patent Airway and No signs of Nausea or vomiting              Post-op Vital Signs: Reviewed and stable  Last Vitals:  Filed Vitals:   08/27/14 0730  BP: 173/81  Temp: 37 C  Resp: 38    Complications: No apparent anesthesia complications

## 2014-08-27 NOTE — Op Note (Signed)
08/27/2014  8:39 AM  PATIENT:  Loanne Drilling  73 y.o. female  PRE-OPERATIVE DIAGNOSIS:  nuclear cataract right eye  POST-OPERATIVE DIAGNOSIS:  nuclear cataract right eye  PROCEDURE:  Procedure(s): CATARACT EXTRACTION PHACO AND INTRAOCULAR LENS PLACEMENT (IOC)  SURGEON:  Surgeon(s): Susa Simmonds, MD  ASSISTANTS:  Juanito Doom, CST   ANESTHESIA STAFF: Anesthesiologist: Laurene Footman, MD CRNA: Moshe Salisbury, CRNA  ANESTHESIA:   topical and MAC  REQUESTED LENS POWER: 20.5  LENS IMPLANT INFORMATION:  Alcon SN60WF  S/n [id number]  Exp  02/2019  CUMULATIVE DISSIPATED ENERGY:33.79  INDICATIONS:see scanned office H&P  OP FINDINGS:dense NS  COMPLICATIONS:None  PROCEDURE:  The patient was brought to the operating room in good condition.  The operative eye was prepped and draped in the usual fashion for intraocular surgery.  Lidocaine gel was dropped onto the eye.  A 2.4 mm 10 O'clock near clear corneal stepped incision and a 12 O'clock stab incision were created.  Viscoat was instilled into the anterior chamber.  The 5 mm anterior capsulorhexis was performed with a bent needle cystotome and Utrata forceps.  The lens was hydrodissected and hydrodelineated with a cannula and balanced salt solution and rotated with a Kuglen hook.  Phacoemulsification was perfomed in the divide and conquer technique.  The remaining cortex was removed with I&A and the capsular surfaces polished as necessary.  Provisc was placed into the capsular bag and the lens inserted with the Alcon inserter.  The viscoelastic was removed with I&A and the lens "rocked" into position.  The wounds were hydrated and te anterior chamber was refilled with balanced salt solution.  The wounds were checked for leakage and rehydrated as necessary.  The lid speculum and drapes were removed and the patient was transported to short stay in good condition.  PATIENT DISPOSITION:  Short Stay

## 2014-08-27 NOTE — Brief Op Note (Signed)
08/27/2014  8:39 AM  PATIENT:  Debra Grimes  74 y.o. female  PRE-OPERATIVE DIAGNOSIS:  nuclear cataract right eye  POST-OPERATIVE DIAGNOSIS:  nuclear cataract right eye  PROCEDURE:  Procedure(s): CATARACT EXTRACTION PHACO AND INTRAOCULAR LENS PLACEMENT (IOC)  SURGEON:  Surgeon(s): Susa Simmonds, MD  ASSISTANTS:  Juanito Doom, CST   ANESTHESIA STAFF: Anesthesiologist: Laurene Footman, MD CRNA: Moshe Salisbury, CRNA  ANESTHESIA:   topical and MAC  REQUESTED LENS POWER: 20.5  LENS IMPLANT INFORMATION:  Alcon SN60WF  S/n [id number]  Exp  02/2019  CUMULATIVE DISSIPATED ENERGY:33.79  INDICATIONS:see scanned office H&P  OP FINDINGS:dense NS  COMPLICATIONS:None  DICTATION #: none  PLAN OF CARE: as above  PATIENT DISPOSITION:  Short Stay

## 2014-08-27 NOTE — H&P (Signed)
I have reviewed the pre printed H&P, the patient was re-examined, and I have identified no significant interval changes in the patient's medical condition.  There is no change in the plan of care since the history and physical of record. 

## 2014-08-28 ENCOUNTER — Encounter (HOSPITAL_COMMUNITY): Payer: Self-pay | Admitting: Ophthalmology

## 2014-12-24 NOTE — Patient Instructions (Signed)
Debra Grimes  12/24/2014     @PREFPERIOPPHARMACY @   Your procedure is scheduled on 12/31/14.  Report to [facility] at 7:00 A.M.  Call this number if you have problems the morning of surgery:  [phone]   Remember:  Do not eat food or drink liquids after midnight.  Take these medicines the morning of surgery with A SIP OF WATER - Gabapentin, Actonel    DO NOT TAKE DIABETIC MEDICATION MORNING OF PROCEDURE   Do not wear jewelry, make-up or nail polish.  Do not wear lotions, powders, or perfumes.  You may wear deodorant.  Do not shave 48 hours prior to surgery.  Men may shave face and neck.  Do not bring valuables to the hospital.  [facility] any belongings or valuables.  Contacts, dentures or bridgework may not be worn into surgery.  Leave your suitcase in the car.  After surgery it may be brought to your room.  For patients admitted to the hospital, discharge time will be determined by your treatment team.  Patients discharged the day of surgery will not be allowed to drive home.    Please read over the following fact sheets that you were given. Anesthesia Post-op Instructions     PATIENT INSTRUCTIONS POST-ANESTHESIA  IMMEDIATELY FOLLOWING SURGERY:  Do not drive or operate machinery for the first twenty four hours after surgery.  Do not make any important decisions for twenty four hours after surgery or while taking narcotic pain medications or sedatives.  If you develop intractable nausea and vomiting or a severe headache please notify your doctor immediately.  FOLLOW-UP:  Please make an appointment with your surgeon as instructed. You do not need to follow up with anesthesia unless specifically instructed to do so.  WOUND CARE INSTRUCTIONS (if applicable):  Keep a dry clean dressing on the anesthesia/puncture wound site if there is drainage.  Once the wound has quit draining you may leave it open to air.  Generally you should leave the  bandage intact for twenty four hours unless there is drainage.  If the epidural site drains for more than 36-48 hours please call the anesthesia department.  QUESTIONS?:  Please feel free to call your physician or the hospital operator if you have any questions, and they will be happy to assist you.       A cataract is a clouding of the lens of the eye. When a lens becomes cloudy, vision is reduced based on the degree and nature of the clouding. Surgery may be needed to improve vision. Surgery removes the cloudy lens and usually replaces it with a substitute lens (intraocular lens, IOL). LET YOUR EYE DOCTOR KNOW ABOUT:  Allergies to food or medicine.  Medicines taken including herbs, eye drops, over-the-counter medicines, and creams.  Use of steroids (by mouth or creams).  Previous problems with anesthetics or numbing medicine.  History of bleeding problems or blood clots.  Previous surgery.  Other health problems, including diabetes and kidney problems.  Possibility of pregnancy, if this applies. RISKS AND COMPLICATIONS  Infection.  Inflammation of the eyeball (endophthalmitis) that can spread to both eyes (sympathetic ophthalmia).  Poor wound healing.  If an IOL is inserted, it can later fall out of proper position. This is very uncommon.  Clouding of the part of your eye that holds an IOL in place. This is called an "after-cataract." These are uncommon but easily treated. BEFORE THE PROCEDURE  Do not eat or drink anything  except small amounts of water for 8 to 12 before your surgery, or as directed by your caregiver.  Unless you are told otherwise, continue any eye drops you have been prescribed.  Talk to your primary caregiver about all other medicines that you take (both prescription and nonprescription). In some cases, you may need to stop or change medicines near the time of your surgery. This is most important if you are taking blood-thinning medicine.Do not stop  medicines unless you are told to do so.  Arrange for someone to drive you to and from the procedure.  Do not put contact lenses in either eye on the day of your surgery. PROCEDURE There is more than one method for safely removing a cataract. Your doctor can explain the differences and help determine which is best for you. Phacoemulsification surgery is the most common form of cataract surgery.  An injection is given behind the eye or eye drops are given to make this a painless procedure.  A small cut (incision) is made on the edge of the clear, dome-shaped surface that covers the front of the eye (cornea).  A tiny probe is painlessly inserted into the eye. This device gives off ultrasound waves that soften and break up the cloudy center of the lens. This makes it easier for the cloudy lens to be removed by suction.  An IOL may be implanted.  The normal lens of the eye is covered by a clear capsule. Part of that capsule is intentionally left in the eye to support the IOL.  Your surgeon may or may not use stitches to close the incision. There are other forms of cataract surgery that require a larger incision and stitches to close the eye. This approach is taken in cases where the doctor feels that the cataract cannot be easily removed using phacoemulsification. AFTER THE PROCEDURE  When an IOL is implanted, it does not need care. It becomes a permanent part of your eye and cannot be seen or felt.  Your doctor will schedule follow-up exams to check on your progress.  Review your other medicines with your doctor to see which can be resumed after surgery.  Use eye drops or take medicine as prescribed by your doctor.   This information is not intended to replace advice given to you by your health care provider. Make sure you discuss any questions you have with your health care provider.   Document Released: 12/18/2010 Document Revised: 01/19/2014 Document Reviewed: 12/18/2010 Elsevier  Interactive Patient Education [employer]

## 2014-12-25 ENCOUNTER — Encounter (HOSPITAL_COMMUNITY)
Admission: RE | Admit: 2014-12-25 | Discharge: 2014-12-25 | Disposition: A | Discharge status: Home / Self Care | Payer: Medicare Other | Source: Ambulatory Visit | Attending: Ophthalmology | Admitting: Ophthalmology

## 2014-12-25 ENCOUNTER — Encounter (HOSPITAL_COMMUNITY): Payer: Self-pay

## 2014-12-25 DIAGNOSIS — H2512 Age-related nuclear cataract, left eye: Secondary | ICD-10-CM | POA: Insufficient documentation

## 2014-12-25 DIAGNOSIS — Z01818 Encounter for other preprocedural examination: Secondary | ICD-10-CM | POA: Diagnosis not present

## 2014-12-25 LAB — CBC
HCT: 36.1 % (ref 36.0–46.0)
HEMOGLOBIN: 12.3 g/dL (ref 12.0–15.0)
MCH: 30.7 pg (ref 26.0–34.0)
MCHC: 34.1 g/dL (ref 30.0–36.0)
MCV: 90 fL (ref 78.0–100.0)
Platelets: 245 10*3/uL (ref 150–400)
RBC: 4.01 MIL/uL (ref 3.87–5.11)
RDW: 12.9 % (ref 11.5–15.5)
WBC: 7.4 10*3/uL (ref 4.0–10.5)

## 2014-12-25 LAB — BASIC METABOLIC PANEL
Anion gap: 12 (ref 5–15)
BUN: 15 mg/dL (ref 6–20)
CHLORIDE: 98 mmol/L — AB (ref 101–111)
CO2: 25 mmol/L (ref 22–32)
Calcium: 10.2 mg/dL (ref 8.9–10.3)
Creatinine, Ser: 0.92 mg/dL (ref 0.44–1.00)
GFR calc non Af Amer: 59 mL/min — ABNORMAL LOW (ref >60)
Glucose, Bld: 145 mg/dL — ABNORMAL HIGH (ref 65–99)
POTASSIUM: 4.2 mmol/L (ref 3.5–5.1)
SODIUM: 135 mmol/L (ref 135–145)

## 2014-12-31 ENCOUNTER — Encounter (HOSPITAL_COMMUNITY)
Admission: RE | Disposition: A | Discharge status: Home / Self Care | Payer: Self-pay | Source: Ambulatory Visit | Attending: Ophthalmology

## 2014-12-31 ENCOUNTER — Ambulatory Visit (HOSPITAL_COMMUNITY)
Admission: RE | Admit: 2014-12-31 | Discharge: 2014-12-31 | Disposition: A | Discharge status: Home / Self Care | Payer: Medicare Other | Source: Ambulatory Visit | Attending: Ophthalmology | Admitting: Ophthalmology

## 2014-12-31 ENCOUNTER — Ambulatory Visit (HOSPITAL_COMMUNITY): Payer: Medicare Other | Admitting: Anesthesiology

## 2014-12-31 ENCOUNTER — Encounter (HOSPITAL_COMMUNITY): Payer: Self-pay | Admitting: *Deleted

## 2014-12-31 DIAGNOSIS — H2512 Age-related nuclear cataract, left eye: Secondary | ICD-10-CM | POA: Diagnosis not present

## 2014-12-31 DIAGNOSIS — Z880 Allergy status to penicillin: Secondary | ICD-10-CM | POA: Insufficient documentation

## 2014-12-31 DIAGNOSIS — M1990 Unspecified osteoarthritis, unspecified site: Secondary | ICD-10-CM | POA: Insufficient documentation

## 2014-12-31 DIAGNOSIS — Z79899 Other long term (current) drug therapy: Secondary | ICD-10-CM | POA: Diagnosis not present

## 2014-12-31 DIAGNOSIS — Z7984 Long term (current) use of oral hypoglycemic drugs: Secondary | ICD-10-CM | POA: Diagnosis not present

## 2014-12-31 DIAGNOSIS — I10 Essential (primary) hypertension: Secondary | ICD-10-CM | POA: Diagnosis not present

## 2014-12-31 DIAGNOSIS — E7800 Pure hypercholesterolemia, unspecified: Secondary | ICD-10-CM | POA: Diagnosis not present

## 2014-12-31 DIAGNOSIS — E119 Type 2 diabetes mellitus without complications: Secondary | ICD-10-CM | POA: Diagnosis not present

## 2014-12-31 HISTORY — PX: CATARACT EXTRACTION W/PHACO: SHX586

## 2014-12-31 LAB — GLUCOSE, CAPILLARY: GLUCOSE-CAPILLARY: 147 mg/dL — AB (ref 65–99)

## 2014-12-31 SURGERY — PHACOEMULSIFICATION, CATARACT, WITH IOL INSERTION
Anesthesia: Monitor Anesthesia Care | Laterality: Left

## 2014-12-31 MED ORDER — TETRACAINE 0.5 % OP SOLN OPTIME - NO CHARGE
OPHTHALMIC | Status: DC | PRN
  Administered 2014-12-31: 1 [drp] via OPHTHALMIC

## 2014-12-31 MED ORDER — BSS IO SOLN
INTRAOCULAR | Status: DC | PRN
  Administered 2014-12-31: 15 mL

## 2014-12-31 MED ORDER — FENTANYL CITRATE (PF) 100 MCG/2ML IJ SOLN
25.0000 ug | INTRAMUSCULAR | Status: AC
  Administered 2014-12-31 (×2): 25 ug via INTRAVENOUS

## 2014-12-31 MED ORDER — TETRACAINE HCL 0.5 % OP SOLN
1.0000 [drp] | OPHTHALMIC | Status: AC
  Administered 2014-12-31 (×3): 1 [drp] via OPHTHALMIC

## 2014-12-31 MED ORDER — NA HYALUR & NA CHOND-NA HYALUR 0.55-0.5 ML IO KIT
PACK | INTRAOCULAR | Status: DC | PRN
  Administered 2014-12-31: 1 via OPHTHALMIC

## 2014-12-31 MED ORDER — MIDAZOLAM HCL 2 MG/2ML IJ SOLN
INTRAMUSCULAR | Status: AC
  Filled 2014-12-31: qty 2

## 2014-12-31 MED ORDER — MIDAZOLAM HCL 2 MG/2ML IJ SOLN
1.0000 mg | INTRAMUSCULAR | Status: DC | PRN
  Administered 2014-12-31 (×2): 2 mg via INTRAVENOUS
  Filled 2014-12-31: qty 2

## 2014-12-31 MED ORDER — LIDOCAINE HCL 3.5 % OP GEL
OPHTHALMIC | Status: DC | PRN
  Administered 2014-12-31: 1 via OPHTHALMIC

## 2014-12-31 MED ORDER — LIDOCAINE HCL 3.5 % OP GEL: 1.0000 "application " | Freq: Once | OPHTHALMIC | Status: DC

## 2014-12-31 MED ORDER — CYCLOPENTOLATE-PHENYLEPHRINE 0.2-1 % OP SOLN
1.0000 [drp] | OPHTHALMIC | Status: AC
  Administered 2014-12-31 (×3): 1 [drp] via OPHTHALMIC

## 2014-12-31 MED ORDER — FENTANYL CITRATE (PF) 100 MCG/2ML IJ SOLN
INTRAMUSCULAR | Status: AC
  Filled 2014-12-31: qty 2

## 2014-12-31 MED ORDER — PHENYLEPHRINE-KETOROLAC 1-0.3 % IO SOLN
INTRAOCULAR | Status: DC | PRN
  Administered 2014-12-31: 500 mL via OPHTHALMIC

## 2014-12-31 MED ORDER — PHENYLEPHRINE-KETOROLAC 1-0.3 % IO SOLN
INTRAOCULAR | Status: AC
  Filled 2014-12-31: qty 4

## 2014-12-31 MED ORDER — POVIDONE-IODINE 5 % OP SOLN
OPHTHALMIC | Status: DC | PRN
  Administered 2014-12-31: 1 via OPHTHALMIC

## 2014-12-31 MED ORDER — LACTATED RINGERS IV SOLN
INTRAVENOUS | Status: DC
  Administered 2014-12-31: 08:00:00 via INTRAVENOUS

## 2014-12-31 SURGICAL SUPPLY — 28 items
CAPSULAR TENSION RING-AMO (OPHTHALMIC RELATED) IMPLANT
CLOTH BEACON ORANGE TIMEOUT ST (SAFETY) ×2 IMPLANT
GLOVE BIO SURGEON STRL SZ7.5 (GLOVE) IMPLANT
GLOVE BIOGEL M 6.5 STRL (GLOVE) IMPLANT
GLOVE BIOGEL PI IND STRL 6.5 (GLOVE) IMPLANT
GLOVE BIOGEL PI IND STRL 7.0 (GLOVE) IMPLANT
GLOVE BIOGEL PI INDICATOR 6.5 (GLOVE)
GLOVE BIOGEL PI INDICATOR 7.0 (GLOVE) ×2
GLOVE ECLIPSE 6.5 STRL STRAW (GLOVE) ×2 IMPLANT
GLOVE ECLIPSE 7.5 STRL STRAW (GLOVE) IMPLANT
GLOVE EXAM NITRILE LRG STRL (GLOVE) IMPLANT
GLOVE EXAM NITRILE MD LF STRL (GLOVE) IMPLANT
GLOVE SKINSENSE NS SZ6.5 (GLOVE)
GLOVE SKINSENSE NS SZ7.0 (GLOVE)
GLOVE SKINSENSE STRL SZ6.5 (GLOVE) IMPLANT
GLOVE SKINSENSE STRL SZ7.0 (GLOVE) IMPLANT
INST SET CATARACT ~~LOC~~ (KITS) ×3 IMPLANT
KIT VITRECTOMY (OPHTHALMIC RELATED) IMPLANT
LENS ALC ACRYL/TECN (Ophthalmic Related) ×3 IMPLANT
PAD ARMBOARD 7.5X6 YLW CONV (MISCELLANEOUS) ×2 IMPLANT
PROC W NO LENS (INTRAOCULAR LENS)
PROC W SPEC LENS (INTRAOCULAR LENS)
PROCESS W NO LENS (INTRAOCULAR LENS) IMPLANT
PROCESS W SPEC LENS (INTRAOCULAR LENS) IMPLANT
RETRACTOR IRIS SIGHTPATH (OPHTHALMIC RELATED) IMPLANT
RING MALYGIN (MISCELLANEOUS) IMPLANT
VISCOELASTIC ADDITIONAL (OPHTHALMIC RELATED) IMPLANT
WATER STERILE IRR 250ML POUR (IV SOLUTION) ×2 IMPLANT

## 2014-12-31 NOTE — Anesthesia Preprocedure Evaluation (Signed)
Anesthesia Evaluation  Patient identified by MRN, date of birth, ID band Patient awake    Reviewed: Allergy & Precautions, NPO status , Patient's Chart, lab work & pertinent test results  Airway Mallampati: II  TM Distance: >3 FB     Dental  (+) Teeth Intact   Pulmonary neg pulmonary ROS,    breath sounds clear to auscultation       Cardiovascular hypertension, Pt. on medications  Rhythm:Regular Rate:Normal     Neuro/Psych    GI/Hepatic negative GI ROS,   Endo/Other  diabetes, Type 2, Oral Hypoglycemic Agents  Renal/GU      Musculoskeletal  (+) Arthritis ,   Abdominal   Peds  Hematology   Anesthesia Other Findings   Reproductive/Obstetrics                             Anesthesia Physical Anesthesia Plan  ASA: III  Anesthesia Plan: MAC   Post-op Pain Management:    Induction: Intravenous  Airway Management Planned: Nasal Cannula  Additional Equipment:   Intra-op Plan:   Post-operative Plan:   Informed Consent: I have reviewed the patients History and Physical, chart, labs and discussed the procedure including the risks, benefits and alternatives for the proposed anesthesia with the patient or authorized representative who has indicated his/her understanding and acceptance.     Plan Discussed with:   Anesthesia Plan Comments:         Anesthesia Quick Evaluation

## 2014-12-31 NOTE — H&P (Signed)
I have reviewed the pre printed H&P, the patient was re-examined, and I have identified no significant interval changes in the patient's medical condition.  There is no change in the plan of care since the history and physical of record. 

## 2014-12-31 NOTE — Discharge Instructions (Signed)

## 2014-12-31 NOTE — Anesthesia Procedure Notes (Signed)
Procedure Name: MAC Date/Time: 12/31/2014 8:40 AM Performed by: Franco NonesYATES, Calen Posch S Pre-anesthesia Checklist: Patient identified, Emergency Drugs available, Suction available, Timeout performed and Patient being monitored Patient Re-evaluated:Patient Re-evaluated prior to inductionOxygen Delivery Method: Nasal Cannula

## 2014-12-31 NOTE — Transfer of Care (Signed)
Immediate Anesthesia Transfer of Care Note  Patient: Debra Grimes DrillingGlenna S Logie  Procedure(s) Performed: Procedure(s) (LRB): CATARACT EXTRACTION PHACO AND INTRAOCULAR LENS PLACEMENT (IOC) (Left)  Patient Location: Shortstay  Anesthesia Type: MAC  Level of Consciousness: awake  Airway & Oxygen Therapy: Patient Spontanous Breathing   Post-op Assessment: Report given to PACU RN, Post -op Vital signs reviewed and stable and Patient moving all extremities  Post vital signs: Reviewed and stable  Complications: No apparent anesthesia complications

## 2014-12-31 NOTE — Anesthesia Postprocedure Evaluation (Signed)
  Anesthesia Post-op Note  Patient: Debra Grimes  Procedure(s) Performed: Procedure(s) (LRB): CATARACT EXTRACTION PHACO AND INTRAOCULAR LENS PLACEMENT (IOC) (Left)  Patient Location:  Short Stay  Anesthesia Type: MAC  Level of Consciousness: awake  Airway and Oxygen Therapy: Patient Spontanous Breathing  Post-op Pain: none  Post-op Assessment: Post-op Vital signs reviewed, Patient's Cardiovascular Status Stable, Respiratory Function Stable, Patent Airway, No signs of Nausea or vomiting and Pain level controlled  Post-op Vital Signs: Reviewed and stable  Complications: No apparent anesthesia complications

## 2014-12-31 NOTE — Op Note (Signed)
12/31/2014  9:22 AM  PATIENT:  Debra Grimes  74 y.o. female  PRE-OPERATIVE DIAGNOSIS:  nuclear cataract left eye  POST-OPERATIVE DIAGNOSIS:  nuclear cataract left eye  PROCEDURE:  Procedure(s): CATARACT EXTRACTION PHACO AND INTRAOCULAR LENS PLACEMENT (IOC)  SURGEON:  Surgeon(s): Susa Simmondsarroll F Issis Lindseth, MD  ASSISTANTS: Marya LandryMaggie Henderson, CST   ANESTHESIA STAFF: Anesthesiologist: Laurene FootmanLuis Gonzalez, MD CRNA: Franco Noneseresa S Yates, CRNA  ANESTHESIA:   topical and MAC  REQUESTED LENS POWER: 22.0  LENS IMPLANT INFORMATION:   Alcon SN60WF  [id number]  Exp  06/2019  CUMULATIVE DISSIPATED ENERGY:8.06  INDICATIONS:see scanned office H&P for particulars  OP FINDINGS:dense NS  COMPLICATIONS:None  PROCEDURE:  The patient was brought to the operating room in good condition.  The operative eye was prepped and draped in the usual fashion for intraocular surgery.  Lidocaine gel was dropped onto the eye.  A 2.4 mm 10 O'clock near clear corneal stepped incision and a 12 O'clock stab incision were created.  Viscoat was instilled into the anterior chamber.  The 5 mm anterior capsulorhexis was performed with a bent needle cystotome and Utrata forceps.  The lens was hydrodissected and hydrodelineated with a cannula and balanced salt solution and rotated with a Kuglen hook.  Phacoemulsification was perfomed in the divide and conquer technique.  The remaining cortex was removed with I&A and the capsular surfaces polished as necessary.  Provisc was placed into the capsular bag and the lens inserted with the Alcon inserter.  The viscoelastic was removed with I&A and the lens "rocked" into position.  The wounds were hydrated and te anterior chamber was refilled with balanced salt solution.  The wounds were checked for leakage and rehydrated as necessary.  The lid speculum and drapes were removed and the patient was transported to short stay in good condition.  PATIENT DISPOSITION:  Short Stay

## 2014-12-31 NOTE — Addendum Note (Signed)
Addendum  created 12/31/14 [id number] by Franco Noneseresa S Lisha Vitale, CRNA   Modules edited: Charges VN

## 2014-12-31 NOTE — Brief Op Note (Signed)
12/31/2014  9:22 AM  PATIENT:  Debra Grimes  74 y.o. female  PRE-OPERATIVE DIAGNOSIS:  nuclear cataract left eye  POST-OPERATIVE DIAGNOSIS:  nuclear cataract left eye  PROCEDURE:  Procedure(s): CATARACT EXTRACTION PHACO AND INTRAOCULAR LENS PLACEMENT (IOC)  SURGEON:  Surgeon(s): Susa Simmondsarroll F Kumiko Fishman, MD  ASSISTANTS: Marya LandryMaggie Henderson, CST   ANESTHESIA STAFF: Anesthesiologist: Laurene FootmanLuis Gonzalez, MD CRNA: Franco Noneseresa S Yates, CRNA  ANESTHESIA:   topical and MAC  REQUESTED LENS POWER: 22.0  LENS IMPLANT INFORMATION:   Alcon SN60WF  [id number]  Exp  06/2019  CUMULATIVE DISSIPATED ENERGY:8.06  INDICATIONS:see scanned office H&P for particulars  OP FINDINGS:dense NS  COMPLICATIONS:None  DICTATION #: none  PLAN OF CARE:   As above  PATIENT DISPOSITION:  Short Stay

## 2015-01-01 ENCOUNTER — Encounter (HOSPITAL_COMMUNITY): Payer: Self-pay | Admitting: Ophthalmology

## 2016-02-26 ENCOUNTER — Emergency Department (HOSPITAL_COMMUNITY): Payer: Medicare Other

## 2016-02-26 ENCOUNTER — Emergency Department (HOSPITAL_COMMUNITY)
Admission: EM | Admit: 2016-02-26 | Discharge: 2016-02-26 | Disposition: A | Discharge status: Home / Self Care | Payer: Medicare Other | Attending: Emergency Medicine | Admitting: Emergency Medicine

## 2016-02-26 ENCOUNTER — Encounter (HOSPITAL_COMMUNITY): Payer: Self-pay | Admitting: Emergency Medicine

## 2016-02-26 DIAGNOSIS — S42292D Other displaced fracture of upper end of left humerus, subsequent encounter for fracture with routine healing: Secondary | ICD-10-CM | POA: Diagnosis not present

## 2016-02-26 DIAGNOSIS — Z79899 Other long term (current) drug therapy: Secondary | ICD-10-CM | POA: Insufficient documentation

## 2016-02-26 DIAGNOSIS — I10 Essential (primary) hypertension: Secondary | ICD-10-CM | POA: Diagnosis not present

## 2016-02-26 DIAGNOSIS — S4992XA Unspecified injury of left shoulder and upper arm, initial encounter: Secondary | ICD-10-CM | POA: Diagnosis present

## 2016-02-26 DIAGNOSIS — Z7984 Long term (current) use of oral hypoglycemic drugs: Secondary | ICD-10-CM | POA: Diagnosis not present

## 2016-02-26 DIAGNOSIS — W010XXA Fall on same level from slipping, tripping and stumbling without subsequent striking against object, initial encounter: Secondary | ICD-10-CM | POA: Diagnosis not present

## 2016-02-26 DIAGNOSIS — E119 Type 2 diabetes mellitus without complications: Secondary | ICD-10-CM | POA: Diagnosis not present

## 2016-02-26 DIAGNOSIS — Y92009 Unspecified place in unspecified non-institutional (private) residence as the place of occurrence of the external cause: Secondary | ICD-10-CM | POA: Diagnosis not present

## 2016-02-26 DIAGNOSIS — Y999 Unspecified external cause status: Secondary | ICD-10-CM | POA: Diagnosis not present

## 2016-02-26 DIAGNOSIS — Y9389 Activity, other specified: Secondary | ICD-10-CM | POA: Diagnosis not present

## 2016-02-26 DIAGNOSIS — S42212A Unspecified displaced fracture of surgical neck of left humerus, initial encounter for closed fracture: Secondary | ICD-10-CM | POA: Insufficient documentation

## 2016-02-26 NOTE — ED Triage Notes (Signed)
Pt c/o left arm pain after tripping and falling yesterday.

## 2016-02-26 NOTE — ED Provider Notes (Signed)
[facility] Provider Note   CSN: [id number] Arrival date & time: 02/26/16  2000  By signing my name below, I, Cynda Acres, attest that this documentation has been prepared under the direction and in the presence of Linwood Dibbles, MD. Electronically Signed: Cynda Acres, Scribe. 02/26/16. 9:09 PM.  History   Chief Complaint Chief Complaint  Patient presents with  . Fall    HPI Debra Grimes is a 76 y.o. female with a hx of DM, HTN, and HLD, who presents to the Emergency Department complaining of a sudden-onset mechanical fall that happened yesterday. Patient states she fell forward while taking the groceries into the house yesterday. Patient reports associated soreness of the left forearm radiating up to the left shoulder and ecchymosis. No modifying factors indicated. Patient denies any elbow pain, blood thinner use, or any other symptoms.   HPI  Past Medical History:  Diagnosis Date  . Arthritis   . Diabetes mellitus without complication (HCC)   . Hypercholesteremia   . Hypertension     Patient Active Problem List   Diagnosis Date Noted  . CLOSED FRACTURE OF PATELLA 05/10/2008  . DIABETES 05/09/2008    Past Surgical History:  Procedure Laterality Date  . CATARACT EXTRACTION W/PHACO Right 08/27/2014   Procedure: CATARACT EXTRACTION PHACO AND INTRAOCULAR LENS PLACEMENT (IOC);  Surgeon: Susa Simmonds, MD;  Location: [facility];  Service: Ophthalmology;  Laterality: Right;  CDE: 33.79  . CATARACT EXTRACTION W/PHACO Left 12/31/2014   Procedure: CATARACT EXTRACTION PHACO AND INTRAOCULAR LENS PLACEMENT (IOC);  Surgeon: Susa Simmonds, MD;  Location: [facility];  Service: Ophthalmology;  Laterality: Left;  CDE 8.06  . ORIF ANKLE FRACTURE Left   . PATELLA FRACTURE SURGERY Right     OB History    No data available       Home Medications    Prior to Admission medications   Medication Sig Start Date End Date Taking? Authorizing Provider  calcium-vitamin D (OSCAL WITH  D) 500-200 MG-UNIT per tablet Take 1 tablet by mouth.    Historical Provider, MD  ezetimibe (ZETIA) 10 MG tablet Take 10 mg by mouth daily.    Historical Provider, MD  gabapentin (NEURONTIN) 300 MG capsule Take 300 mg by mouth 3 (three) times daily.    Historical Provider, MD  metFORMIN (GLUCOPHAGE-XR) 500 MG 24 hr tablet Take 500 mg by mouth 4 (four) times daily.    Historical Provider, MD  risedronate (ACTONEL) 150 MG tablet Take 150 mg by mouth every 30 (thirty) days. Takes on the 19th of the month. with water on empty stomach, nothing by mouth or lie down for next 30 minutes.    Historical Provider, MD  simvastatin (ZOCOR) 40 MG tablet Take 40 mg by mouth daily.    Historical Provider, MD    Family History No family history on file.  Social History Social History  Substance Use Topics  . Smoking status: Never Smoker  . Smokeless tobacco: Never Used  . Alcohol use No     Allergies   Hydrocodone; Oxycodone-acetaminophen; and Penicillins   Review of Systems Review of Systems  Musculoskeletal: Positive for arthralgias (left shouder, left humerous).  All other systems reviewed and are negative.    Physical Exam Updated Vital Signs BP 162/64   Pulse 83   Temp 98.1 F (36.7 C) (Oral)   Resp 20   Ht 5\' 2"  (1.575 m)   Wt 70.3 kg   SpO2 99%   BMI 28.35 kg/m   Physical  Exam  Constitutional: She appears well-developed and well-nourished. No distress.  HENT:  Head: Normocephalic and atraumatic.  Right Ear: External ear normal.  Left Ear: External ear normal.  Eyes: Conjunctivae are normal. Right eye exhibits no discharge. Left eye exhibits no discharge. No scleral icterus.  Neck: Neck supple. No tracheal deviation present.  Cardiovascular: Normal rate.   Pulmonary/Chest: Effort normal. No stridor. No respiratory distress.  Abdominal: She exhibits no distension.  Musculoskeletal: She exhibits no edema.       Left shoulder: She exhibits decreased range of motion and  tenderness.       Left elbow: Normal.       Left wrist: Normal.       Left upper arm: She exhibits tenderness and swelling.  Ecchymosis noted around the left mid humerus.   Neurological: She is alert. Cranial nerve deficit: no gross deficits.  Skin: Skin is warm and dry. No rash noted.  Psychiatric: She has a normal mood and affect.  Nursing note and vitals reviewed.    ED Treatments / Results  DIAGNOSTIC STUDIES: Oxygen Saturation is 99% on RA, normal by my interpretation.    COORDINATION OF CARE: 9:08 PM Discussed treatment plan with pt at bedside and pt agreed to plan, which includes multiple x-rays.   Labs (all labs ordered are listed, but only abnormal results are displayed) Labs Reviewed - No data to display  EKG  EKG Interpretation None       Radiology Dg Elbow Complete Left  Result Date: 02/26/2016 CLINICAL DATA:  Trip and fall injury yesterday. Left arm pain. Initial in counter. EXAM: LEFT ELBOW - COMPLETE 3+ VIEW COMPARISON:  None. FINDINGS: There is no evidence of fracture, dislocation, or joint effusion. There is no evidence of arthropathy or other focal bone abnormality. Soft tissues are unremarkable. IMPRESSION: Negative. Electronically Signed   By: Burman Nieves M.D.   On: 02/26/2016 22:07   Dg Shoulder Left  Result Date: 02/26/2016 CLINICAL DATA:  Status post fall, landing on left arm, with left arm pain. Initial encounter. EXAM: LEFT SHOULDER - 2+ VIEW COMPARISON:  Chest radiograph performed 12/15/2011 FINDINGS: There is a mildly displaced fracture through the left humeral neck, with perhaps 1.2 cm of displacement and impaction. The left humeral head is seated within the glenoid fossa. Minimal degenerative change is noted at the left acromioclavicular joint. Prominent soft tissue swelling is noted at the proximal left arm. The visualized portions of the left lung are clear. IMPRESSION: Mildly displaced fracture through the left humeral neck, with perhaps 1.2  cm of displacement and impaction. Electronically Signed   By: Roanna Raider M.D.   On: 02/26/2016 22:06   Dg Humerus Left  Result Date: 02/26/2016 CLINICAL DATA:  Fall yesterday EXAM: LEFT HUMERUS - 2+ VIEW COMPARISON:  None. FINDINGS: Fractures through the left humeral neck with mild displacement. Normal alignment of the shoulder joint. IMPRESSION: Fracture of the left humeral neck. Electronically Signed   By: Marlan Palau M.D.   On: 02/26/2016 21:22    Procedures Procedures (including critical care time)  Medications Ordered in ED Medications - No data to display   Initial Impression / Assessment and Plan / ED Course  I have reviewed the triage vital signs and the nursing notes.  Pertinent labs & imaging results that were available during my care of the patient were reviewed by me and considered in my medical decision making (see chart for details).   patient's x-rays show a proximal humerus fracture of the  left humeral neck. No other injuries noted on x-rays. Patient will be placed in a sling. Discharged home to follow up with her orthopedic doctor, Dr. Romeo AppleHarrison.  Final Clinical Impressions(s) / ED Diagnoses   Final diagnoses:  Closed fracture of neck of left humerus, initial encounter    New Prescriptions New Prescriptions   No medications on file   I personally performed the services described in this documentation, which was scribed in my presence.  The recorded information has been reviewed and is accurate.     Linwood DibblesJon Kallen Delatorre, MD 02/26/16 2248

## 2016-02-26 NOTE — Discharge Instructions (Signed)
Keep the sling on to reduce arm movement, follow-up with your orthopedic doctor, Dr. Romeo AppleHarrison, take tylenol or ibuprofen as needed for pain

## 2016-02-27 ENCOUNTER — Encounter: Payer: Self-pay | Admitting: Orthopaedic Surgery

## 2016-02-27 ENCOUNTER — Ambulatory Visit (INDEPENDENT_AMBULATORY_CARE_PROVIDER_SITE_OTHER): Payer: Medicare Other | Admitting: Orthopaedic Surgery

## 2016-02-27 VITALS — BP 108/58 | HR 80 | Temp 98.1°F | Ht 63.0 in | Wt 159.0 lb

## 2016-02-27 DIAGNOSIS — S42292A Other displaced fracture of upper end of left humerus, initial encounter for closed fracture: Secondary | ICD-10-CM | POA: Diagnosis not present

## 2016-02-27 NOTE — Progress Notes (Signed)
Subjective:    Patient ID: Debra Grimes DrillingGlenna S Kreiser, female    DOB: 11/24/1940, 76 y.o.   MRN: [id number]  HPI She fell yesterday and hurt her left shoulder.  She was seen in the ER.  X-rays show an impacted left humeral fracture.  She has no other injury. She has a shoulder immobilizer.   Review of Systems  HENT: Positive for hearing loss. Negative for congestion.   Respiratory: Negative for cough and shortness of breath.   Cardiovascular: Negative for chest pain and leg swelling.  Endocrine: Positive for cold intolerance.  Musculoskeletal: Positive for arthralgias and joint swelling.  Allergic/Immunologic: Positive for environmental allergies.   Past Medical History:  Diagnosis Date  . Arthritis   . Diabetes mellitus without complication (HCC)   . Hypercholesteremia   . Hypertension     Past Surgical History:  Procedure Laterality Date  . CATARACT EXTRACTION W/PHACO Right 08/27/2014   Procedure: CATARACT EXTRACTION PHACO AND INTRAOCULAR LENS PLACEMENT (IOC);  Surgeon: Susa Simmondsarroll F Haines, MD;  Location: [facility];  Service: Ophthalmology;  Laterality: Right;  CDE: 33.79  . CATARACT EXTRACTION W/PHACO Left 12/31/2014   Procedure: CATARACT EXTRACTION PHACO AND INTRAOCULAR LENS PLACEMENT (IOC);  Surgeon: Susa Simmondsarroll F Haines, MD;  Location: [facility];  Service: Ophthalmology;  Laterality: Left;  CDE 8.06  . ORIF ANKLE FRACTURE Left   . PATELLA FRACTURE SURGERY Right     Current Outpatient Prescriptions on File Prior to Visit  Medication Sig Dispense Refill  . calcium-vitamin D (OSCAL WITH D) 500-200 MG-UNIT per tablet Take 1 tablet by mouth.    . ezetimibe (ZETIA) 10 MG tablet Take 10 mg by mouth daily.    Marland Kitchen. gabapentin (NEURONTIN) 300 MG capsule Take 300 mg by mouth 3 (three) times daily.    . metFORMIN (GLUCOPHAGE-XR) 500 MG 24 hr tablet Take 500 mg by mouth 4 (four) times daily.    . risedronate (ACTONEL) 150 MG tablet Take 150 mg by mouth every 30 (thirty) days. Takes on the 19th of the month.  with water on empty stomach, nothing by mouth or lie down for next 30 minutes.    . simvastatin (ZOCOR) 40 MG tablet Take 40 mg by mouth daily.     No current facility-administered medications on file prior to visit.     Social History   Social History  . Marital status: Widowed    Spouse name: N/A  . Number of children: N/A  . Years of education: N/A   Occupational History  . Not on file.   Social History Main Topics  . Smoking status: Never Smoker  . Smokeless tobacco: Never Used  . Alcohol use No  . Drug use: No  . Sexual activity: No   Other Topics Concern  . Not on file   Social History Narrative  . No narrative on file    No family history on file.  BP (!) 108/58   Pulse 80   Temp 98.1 F (36.7 C)   Ht 5\' 3"  (1.6 m)   Wt 159 lb (72.1 kg)   BMI 28.17 kg/m      Objective:   Physical Exam  Constitutional: She is oriented to person, place, and time. She appears well-developed and well-nourished.  HENT:  Head: Normocephalic and atraumatic.  Eyes: Conjunctivae and EOM are normal. Pupils are equal, round, and reactive to light.  Neck: Normal range of motion. Neck supple.  Cardiovascular: Normal rate, regular rhythm and intact distal pulses.   Pulmonary/Chest: Effort normal.  Abdominal: Soft.  Musculoskeletal: She exhibits tenderness (Pain left shoulder, in shoulder immobilzer, no motion attempted.  Ecchymosis more at elbow.  NV intact.  ROM neck full.  Right shoulder negative.Marland Kitchen).  Neurological: She is alert and oriented to person, place, and time. She displays normal reflexes. No cranial nerve deficit. She exhibits normal muscle tone. Coordination normal.  Skin: Skin is warm and dry.  Psychiatric: She has a normal mood and affect. Her behavior is normal. Judgment and thought content normal.  Vitals reviewed.         Assessment & Plan:   Encounter Diagnosis  Name Primary?  . Other closed displaced fracture of proximal end of left humerus, initial  encounter Yes   X-rays on return in one week.  Continue shoulder immobilizer.  Sleep semi-erect.  Call if any problem.  Precautions discussed.  Electronically Signed Darreld Mclean, MD 2/15/20182:45 PM

## 2016-03-05 ENCOUNTER — Ambulatory Visit (INDEPENDENT_AMBULATORY_CARE_PROVIDER_SITE_OTHER): Payer: Medicare Other

## 2016-03-05 ENCOUNTER — Ambulatory Visit (INDEPENDENT_AMBULATORY_CARE_PROVIDER_SITE_OTHER): Payer: Self-pay | Admitting: Orthopaedic Surgery

## 2016-03-05 DIAGNOSIS — S42295D Other nondisplaced fracture of upper end of left humerus, subsequent encounter for fracture with routine healing: Secondary | ICD-10-CM

## 2016-03-05 NOTE — Progress Notes (Signed)
CC: my shoulder is hurting less  She is doing better with the left shoulder fracture.  She has less pain and less swelling.  She has been in her sling.  NV is intact.  The ecchymosis is resolving.  Encounter Diagnosis  Name Primary?  . Other closed nondisplaced fracture of proximal end of left humerus with routine healing, subsequent encounter Yes   X-rays were done, reported separately.  Return in three weeks with x-rays then.  Call if any problem.  Precautions and some circumduction exercises were discussed.  Electronically Signed Darreld McleanWayne Itzel Lowrimore, MD 2/22/20183:11 PM

## 2016-03-26 ENCOUNTER — Ambulatory Visit (INDEPENDENT_AMBULATORY_CARE_PROVIDER_SITE_OTHER): Payer: Medicare Other

## 2016-03-26 ENCOUNTER — Ambulatory Visit (INDEPENDENT_AMBULATORY_CARE_PROVIDER_SITE_OTHER): Payer: Self-pay | Admitting: Orthopaedic Surgery

## 2016-03-26 ENCOUNTER — Encounter: Payer: Self-pay | Admitting: Orthopaedic Surgery

## 2016-03-26 DIAGNOSIS — S42295D Other nondisplaced fracture of upper end of left humerus, subsequent encounter for fracture with routine healing: Secondary | ICD-10-CM | POA: Diagnosis not present

## 2016-03-26 NOTE — Progress Notes (Signed)
CC:  My shoulder is better  She has less pain of the left shoulder.  NV is intact.  She is in the sling.  X-rays were done reported separately.  Encounter Diagnosis  Name Primary?  . Other closed nondisplaced fracture of proximal end of left humerus with routine healing, subsequent encounter Yes   Begin home health PT.  Return in two weeks.  X-rays of the left shoulder on return.  Electronically Signed Darreld McleanWayne Jonah Gingras, MD 3/15/20183:39 PM

## 2016-04-09 ENCOUNTER — Ambulatory Visit (INDEPENDENT_AMBULATORY_CARE_PROVIDER_SITE_OTHER): Payer: Self-pay | Admitting: Orthopaedic Surgery

## 2016-04-09 ENCOUNTER — Encounter: Payer: Self-pay | Admitting: Orthopaedic Surgery

## 2016-04-09 ENCOUNTER — Ambulatory Visit (INDEPENDENT_AMBULATORY_CARE_PROVIDER_SITE_OTHER): Payer: Medicare Other

## 2016-04-09 DIAGNOSIS — S42295D Other nondisplaced fracture of upper end of left humerus, subsequent encounter for fracture with routine healing: Secondary | ICD-10-CM

## 2016-04-09 NOTE — Progress Notes (Signed)
CC:  I am much better  She has had PT at home.  She is doing well. I have read the PT notes.  NV intact.  Motion is improving slowly.  X-rays were done today, reported separately.  Encounter Diagnosis  Name Primary?  . Other closed nondisplaced fracture of proximal end of left humerus with routine healing, subsequent encounter Yes    Continue exercises at home.  Stop sling, one more PT and then stop PT.  Return in one month.  X-rays of left shoulder on return.  Call if any problem.  Precautions discussed.  Electronically Signed Darreld McleanWayne Elvin Banker, MD 3/29/20182:45 PM

## 2016-05-07 ENCOUNTER — Encounter: Payer: Self-pay | Admitting: Orthopaedic Surgery

## 2016-05-07 ENCOUNTER — Ambulatory Visit (INDEPENDENT_AMBULATORY_CARE_PROVIDER_SITE_OTHER): Payer: Medicare Other

## 2016-05-07 ENCOUNTER — Ambulatory Visit (INDEPENDENT_AMBULATORY_CARE_PROVIDER_SITE_OTHER): Payer: Self-pay | Admitting: Orthopaedic Surgery

## 2016-05-07 DIAGNOSIS — S42295D Other nondisplaced fracture of upper end of left humerus, subsequent encounter for fracture with routine healing: Secondary | ICD-10-CM

## 2016-05-07 NOTE — Progress Notes (Signed)
CC:  My shoulder pops at times.  She has been doing her own therapy on her left shoulder.  She has no pain but has some popping at times.  She has no new trauma or paresthesias.  NV intact. ROM is very good but she does have some crepitus.  X-rays were done, reported separately.  She has some early cyst formation in the humeral head.  Encounter Diagnosis  Name Primary?  . Other closed nondisplaced fracture of proximal end of left humerus with routine healing, subsequent encounter Yes   She is to continue her exercises.  I want to see her in one month with x-rays then.  Call if any problem.  Precautions given.  Electronically Signed Darreld Mclean, MD 4/26/20182:51 PM

## 2016-06-04 ENCOUNTER — Encounter: Payer: Self-pay | Admitting: Orthopaedic Surgery

## 2016-06-04 ENCOUNTER — Ambulatory Visit (INDEPENDENT_AMBULATORY_CARE_PROVIDER_SITE_OTHER): Payer: Self-pay | Admitting: Orthopaedic Surgery

## 2016-06-04 ENCOUNTER — Ambulatory Visit (INDEPENDENT_AMBULATORY_CARE_PROVIDER_SITE_OTHER): Payer: Medicare Other

## 2016-06-04 VITALS — BP 124/79 | HR 79 | Temp 97.9°F | Ht 63.0 in | Wt 159.0 lb

## 2016-06-04 DIAGNOSIS — S42295D Other nondisplaced fracture of upper end of left humerus, subsequent encounter for fracture with routine healing: Secondary | ICD-10-CM | POA: Diagnosis not present

## 2016-06-04 NOTE — Progress Notes (Signed)
CC:  My shoulder is better  She has little pain with the left shoulder.    NV intact.  ROM is good with abduction 100, forward 135.  X-rays were done reported separately.  Encounter Diagnosis  Name Primary?  . Other closed nondisplaced fracture of proximal end of left humerus with routine healing, subsequent encounter Yes   Discharge.  Precautions discussed.  Call if any problem.  Electronically Signed Darreld McleanWayne Faigy Stretch, MD 5/24/20182:38 PM

## 2016-08-11 DIAGNOSIS — I10 Essential (primary) hypertension: Secondary | ICD-10-CM | POA: Diagnosis not present

## 2016-08-11 DIAGNOSIS — E11311 Type 2 diabetes mellitus with unspecified diabetic retinopathy with macular edema: Secondary | ICD-10-CM | POA: Diagnosis not present

## 2016-08-11 DIAGNOSIS — E785 Hyperlipidemia, unspecified: Secondary | ICD-10-CM | POA: Diagnosis not present

## 2016-11-04 DIAGNOSIS — E785 Hyperlipidemia, unspecified: Secondary | ICD-10-CM | POA: Diagnosis not present

## 2016-11-04 DIAGNOSIS — I10 Essential (primary) hypertension: Secondary | ICD-10-CM | POA: Diagnosis not present

## 2016-11-04 DIAGNOSIS — E11311 Type 2 diabetes mellitus with unspecified diabetic retinopathy with macular edema: Secondary | ICD-10-CM | POA: Diagnosis not present

## 2016-11-17 DIAGNOSIS — E785 Hyperlipidemia, unspecified: Secondary | ICD-10-CM | POA: Diagnosis not present

## 2016-11-17 DIAGNOSIS — Z0001 Encounter for general adult medical examination with abnormal findings: Secondary | ICD-10-CM | POA: Diagnosis not present

## 2016-11-17 DIAGNOSIS — I10 Essential (primary) hypertension: Secondary | ICD-10-CM | POA: Diagnosis not present

## 2016-11-17 DIAGNOSIS — E11311 Type 2 diabetes mellitus with unspecified diabetic retinopathy with macular edema: Secondary | ICD-10-CM | POA: Diagnosis not present

## 2016-12-07 ENCOUNTER — Other Ambulatory Visit (HOSPITAL_COMMUNITY): Payer: Self-pay | Admitting: Family Medicine

## 2016-12-07 DIAGNOSIS — M810 Age-related osteoporosis without current pathological fracture: Secondary | ICD-10-CM

## 2016-12-23 ENCOUNTER — Ambulatory Visit (HOSPITAL_COMMUNITY)
Admission: RE | Admit: 2016-12-23 | Discharge: 2016-12-23 | Disposition: A | Discharge status: Home / Self Care | Payer: Medicare Other | Source: Ambulatory Visit | Attending: Family Medicine | Admitting: Family Medicine

## 2016-12-23 DIAGNOSIS — M8589 Other specified disorders of bone density and structure, multiple sites: Secondary | ICD-10-CM | POA: Insufficient documentation

## 2016-12-23 DIAGNOSIS — M810 Age-related osteoporosis without current pathological fracture: Secondary | ICD-10-CM | POA: Diagnosis present

## 2017-05-10 DIAGNOSIS — E1140 Type 2 diabetes mellitus with diabetic neuropathy, unspecified: Secondary | ICD-10-CM | POA: Diagnosis not present

## 2017-05-10 DIAGNOSIS — E785 Hyperlipidemia, unspecified: Secondary | ICD-10-CM | POA: Diagnosis not present

## 2017-05-10 DIAGNOSIS — I10 Essential (primary) hypertension: Secondary | ICD-10-CM | POA: Diagnosis not present

## 2017-05-18 DIAGNOSIS — I10 Essential (primary) hypertension: Secondary | ICD-10-CM | POA: Diagnosis not present

## 2017-05-18 DIAGNOSIS — E785 Hyperlipidemia, unspecified: Secondary | ICD-10-CM | POA: Diagnosis not present

## 2017-05-18 DIAGNOSIS — E1140 Type 2 diabetes mellitus with diabetic neuropathy, unspecified: Secondary | ICD-10-CM | POA: Diagnosis not present

## 2017-08-02 DIAGNOSIS — E119 Type 2 diabetes mellitus without complications: Secondary | ICD-10-CM | POA: Diagnosis not present

## 2017-08-17 DIAGNOSIS — I10 Essential (primary) hypertension: Secondary | ICD-10-CM | POA: Diagnosis not present

## 2017-08-17 DIAGNOSIS — E785 Hyperlipidemia, unspecified: Secondary | ICD-10-CM | POA: Diagnosis not present

## 2017-08-17 DIAGNOSIS — E119 Type 2 diabetes mellitus without complications: Secondary | ICD-10-CM | POA: Diagnosis not present

## 2018-01-25 IMAGING — DX DG ELBOW COMPLETE 3+V*L*
4 series · 4 of 4 positions shown · non-contrast
Comparison: None.

CLINICAL DATA: Trip and fall injury yesterday. Left arm pain.
Initial in counter.

EXAM:
LEFT ELBOW - COMPLETE 3+ VIEW

[elbow ap]
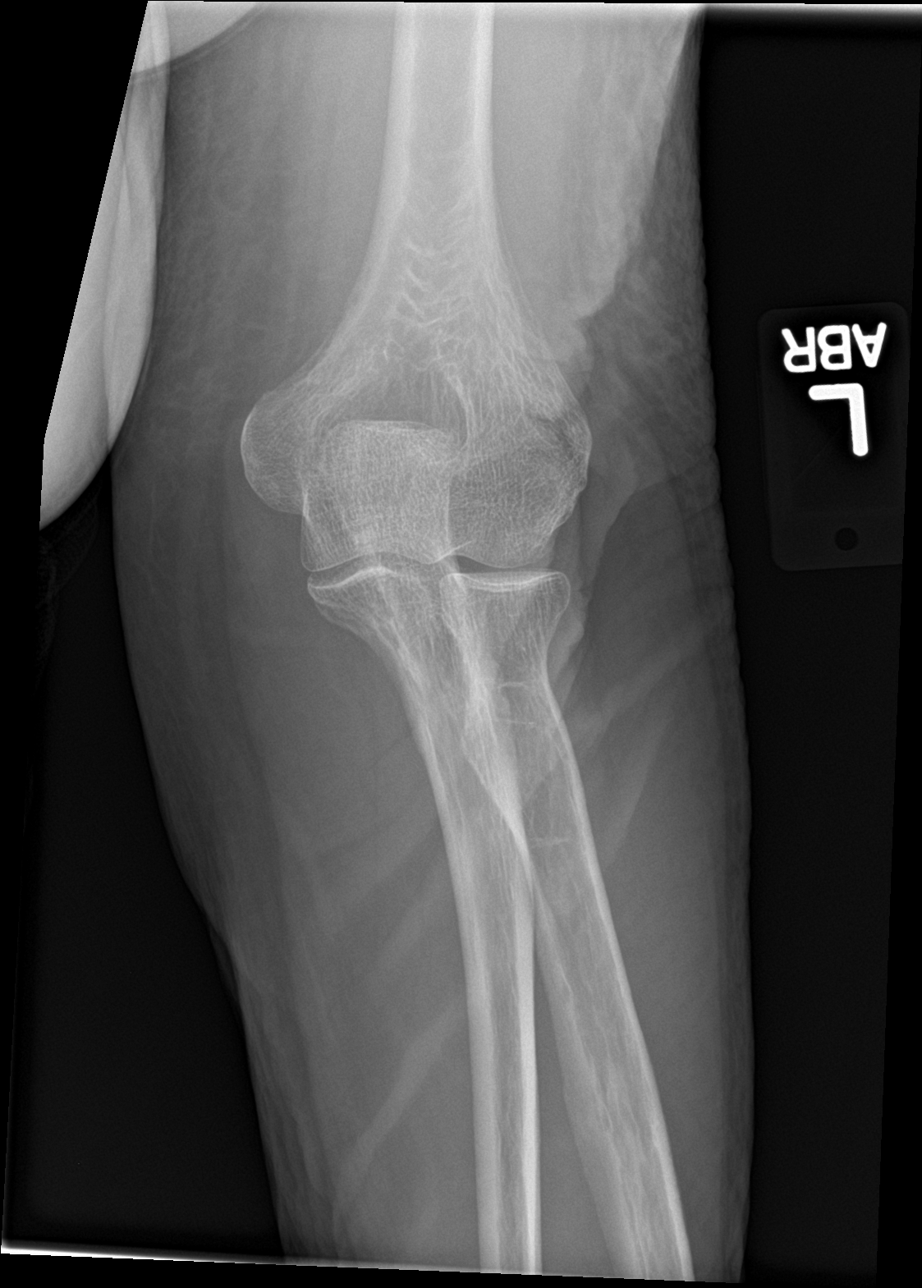

[elbow obl (1 of 2)]
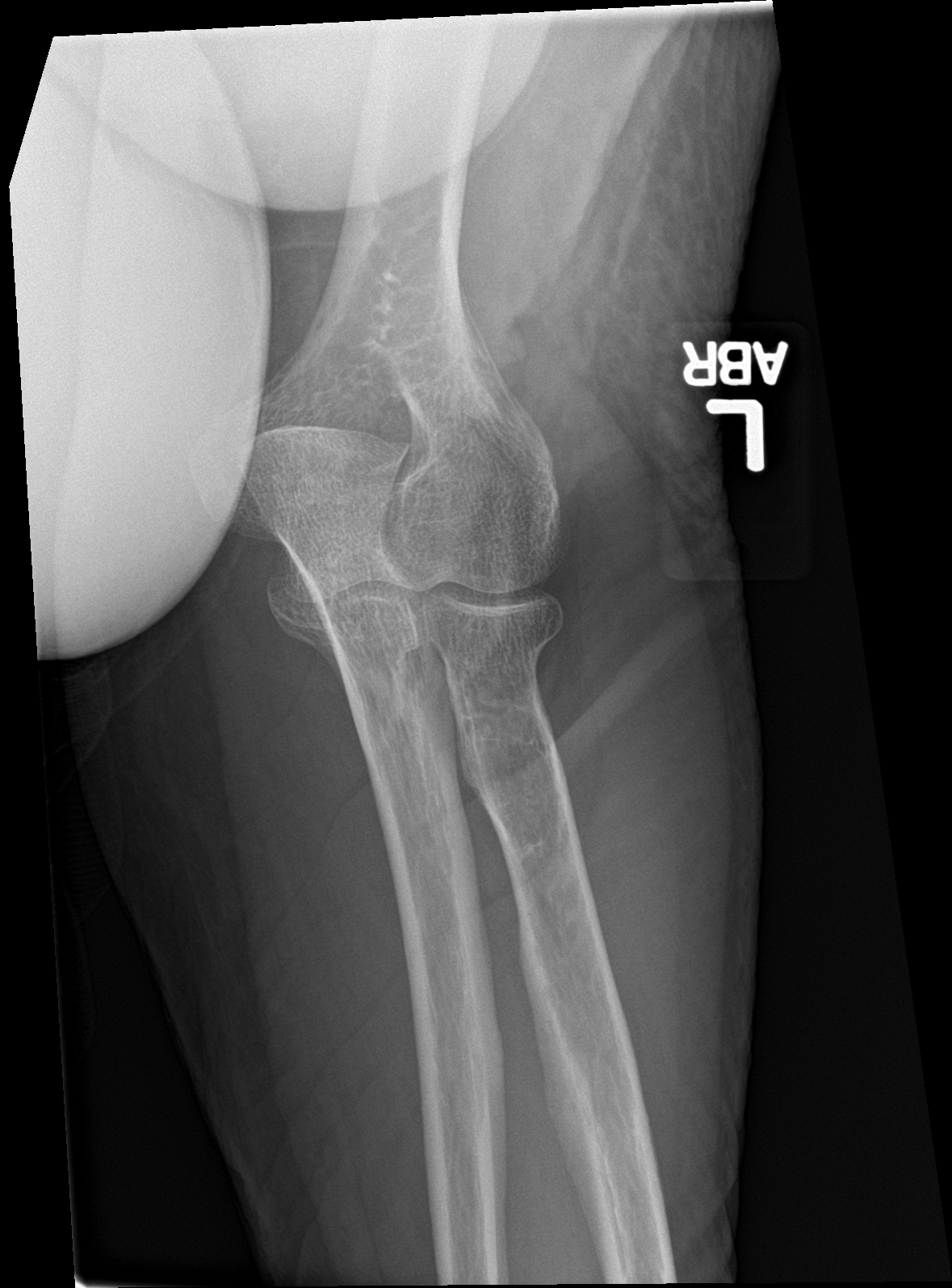

[elbow lat]
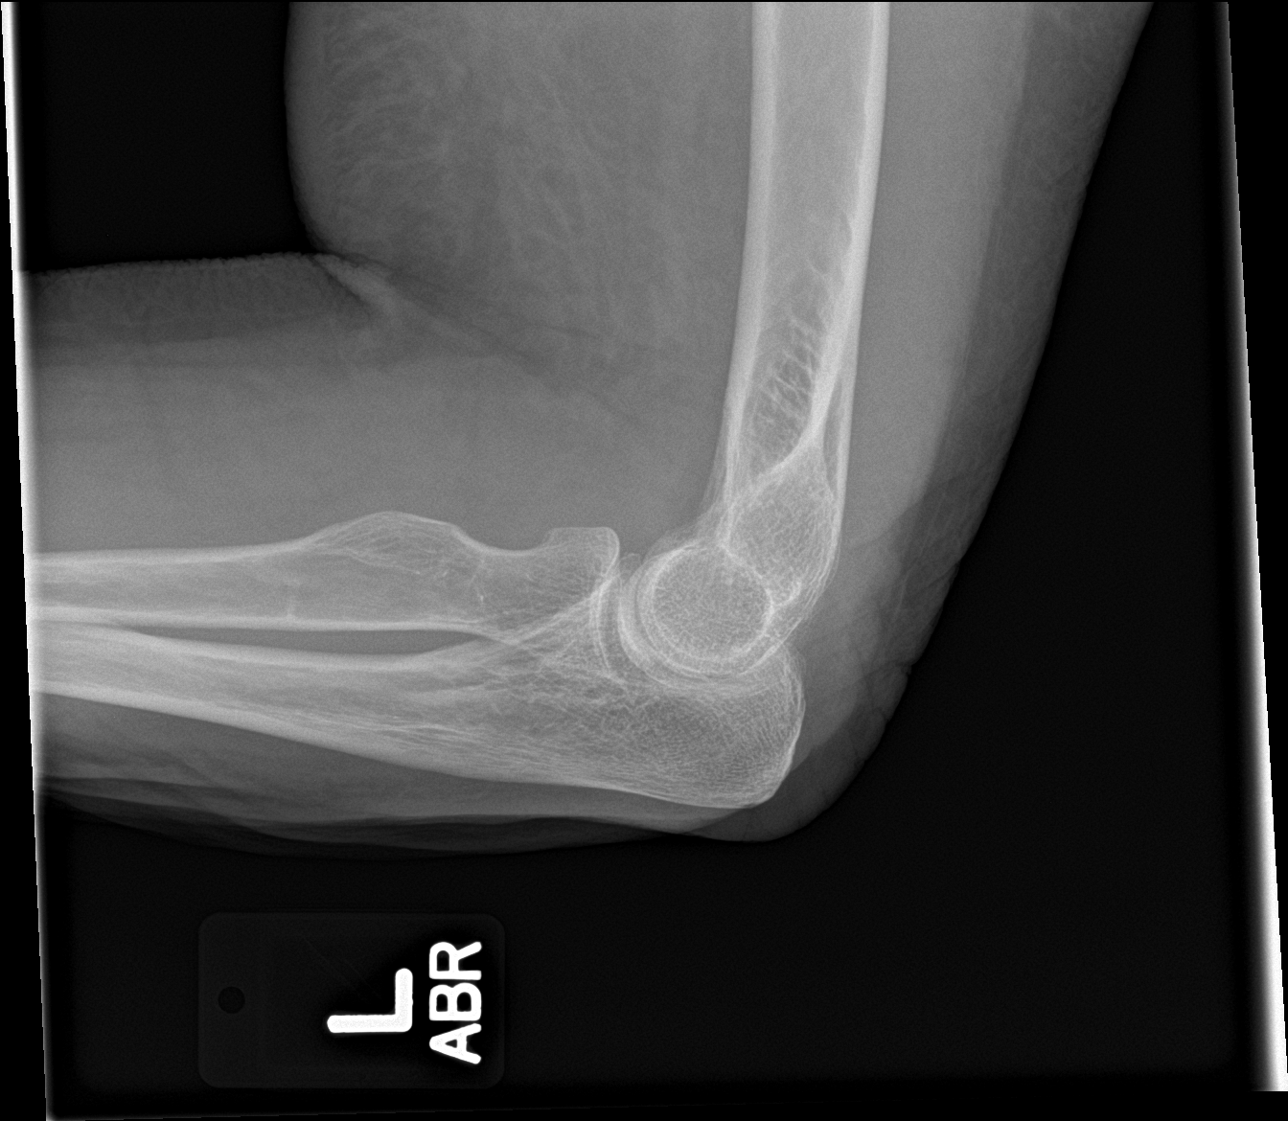

[elbow obl (2 of 2)]
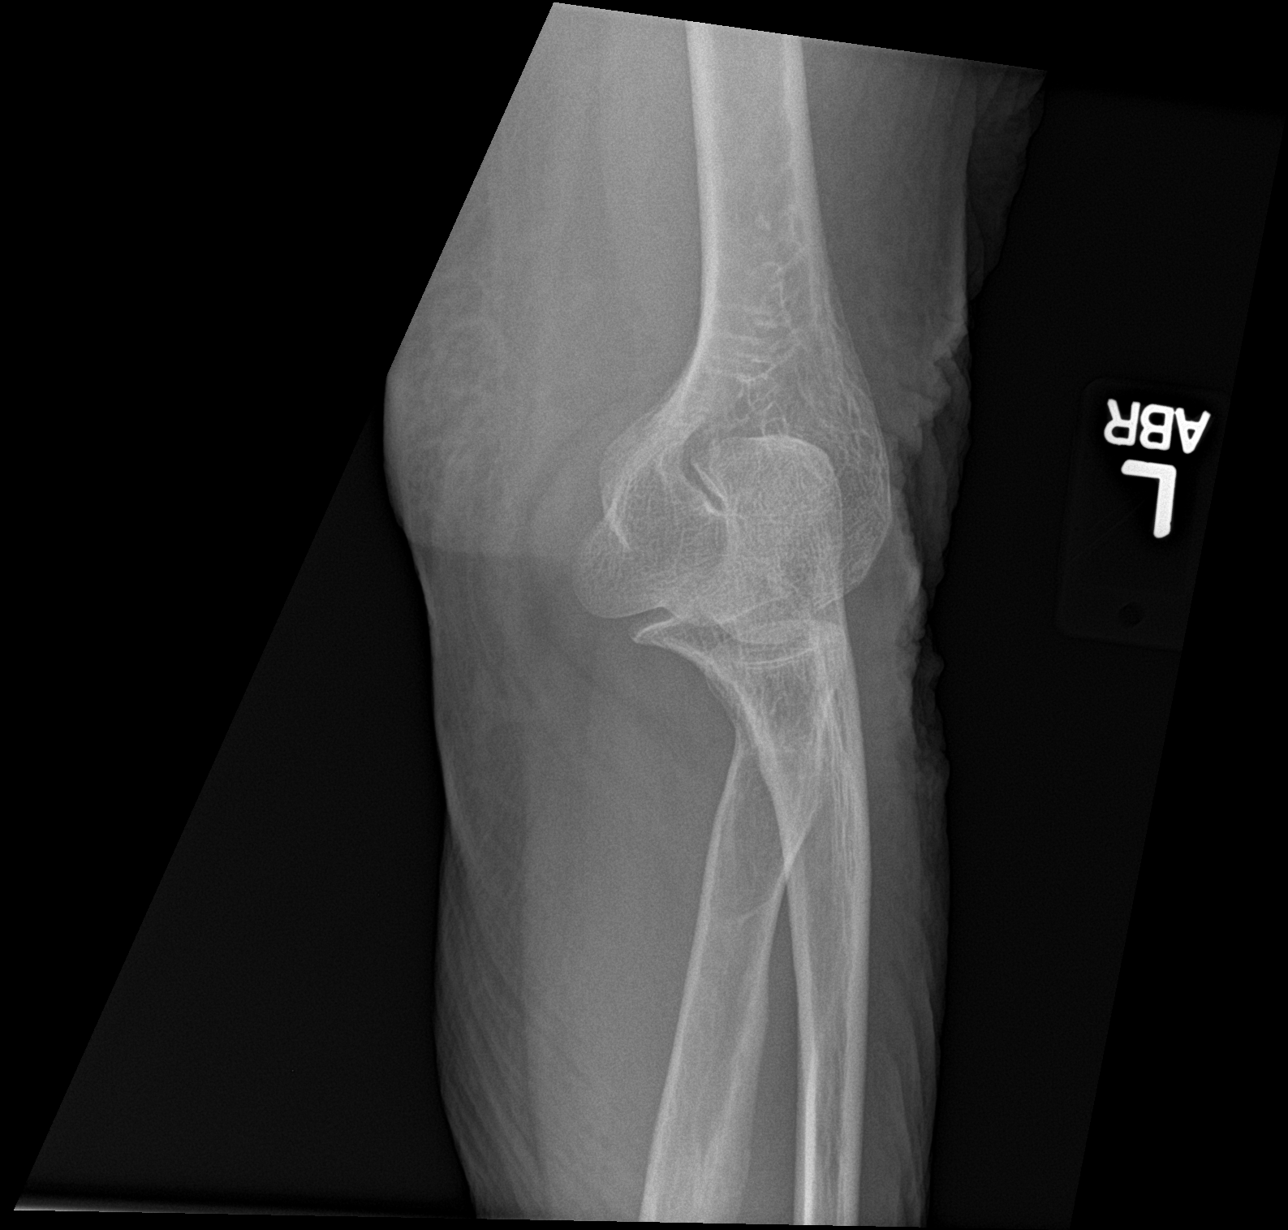

[4 of 4 positions shown; findings below may reference images not displayed]

FINDINGS: There is no evidence of fracture, dislocation, or joint effusion.
There is no evidence of arthropathy or other focal bone abnormality.
Soft tissues are unremarkable.
IMPRESSION: Negative.

## 2018-01-25 IMAGING — DX DG SHOULDER 2+V*L*
2 series · 2 of 2 positions shown · non-contrast
Comparison: Chest radiograph performed 12/15/2011

CLINICAL DATA: Status post fall, landing on left arm, with left arm
pain. Initial encounter.

EXAM:
LEFT SHOULDER - 2+ VIEW

[shoulder grashey]
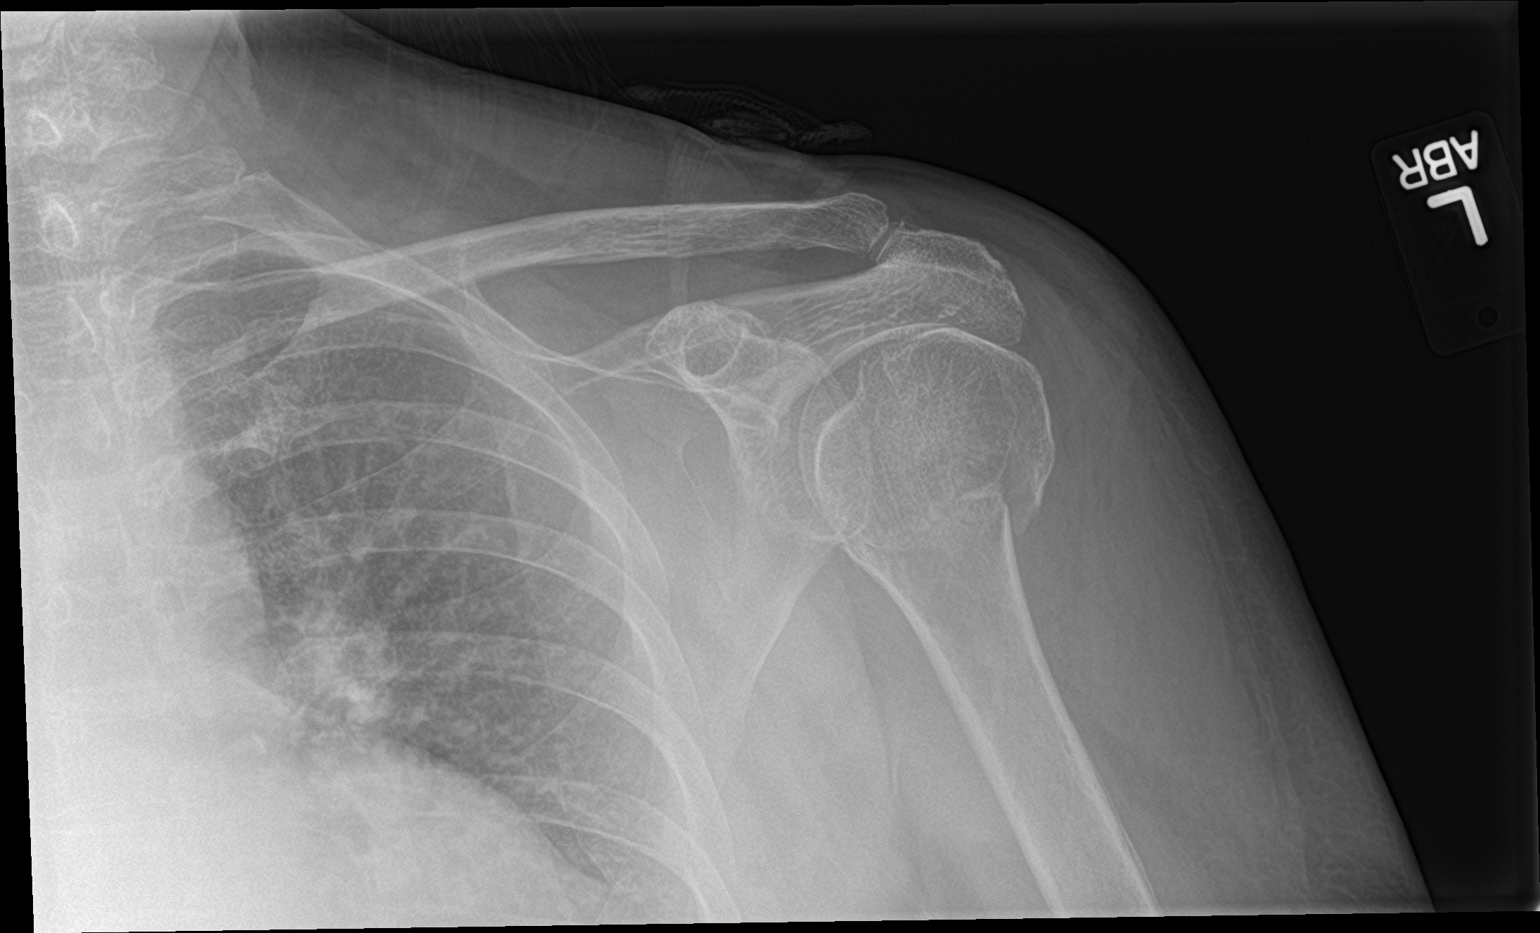

[shoulder y view]
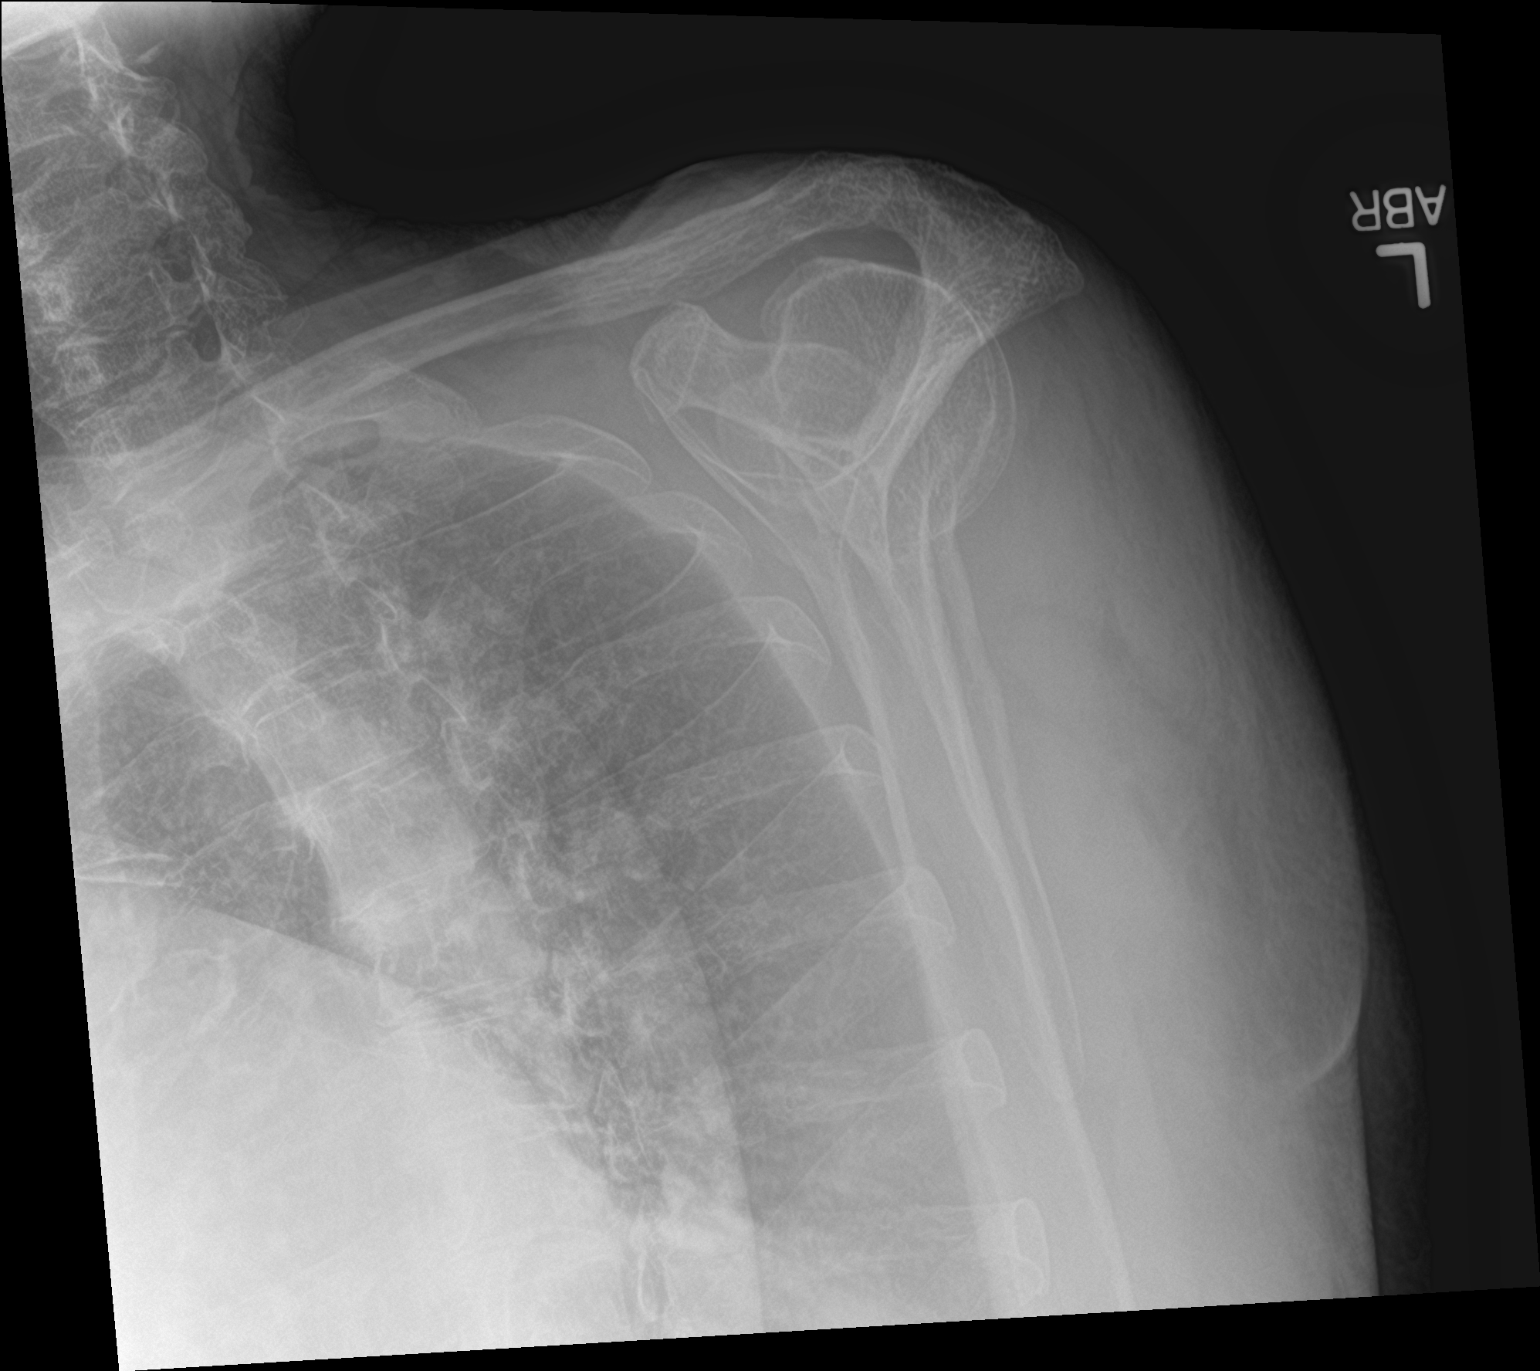

[2 of 2 positions shown; findings below may reference images not displayed]

FINDINGS: There is a mildly displaced fracture through the left humeral neck,
with perhaps 1.2 cm of displacement and impaction.

The left humeral head is seated within the glenoid fossa. Minimal
degenerative change is noted at the left acromioclavicular joint.

Prominent soft tissue swelling is noted at the proximal left arm.
The visualized portions of the left lung are clear.
IMPRESSION: Mildly displaced fracture through the left humeral neck, with
perhaps 1.2 cm of displacement and impaction.

## 2018-01-25 IMAGING — DX DG HUMERUS 2V *L*
3 series · 3 of 3 positions shown · non-contrast
Comparison: None.

CLINICAL DATA: Fall yesterday

EXAM:
LEFT HUMERUS - 2+ VIEW

[humerus lat (1 of 2)]
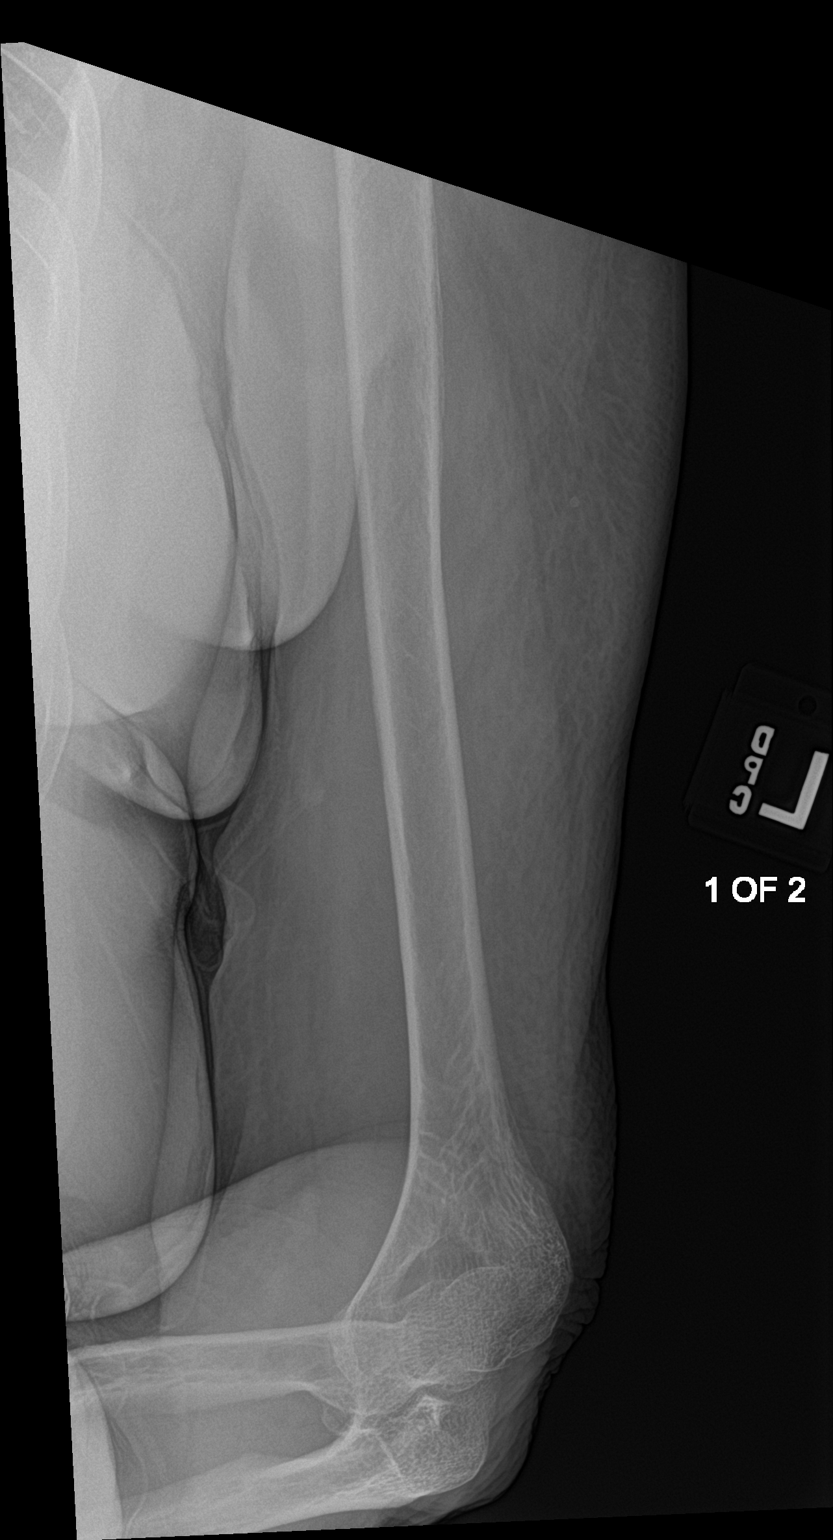

[humerus ap]
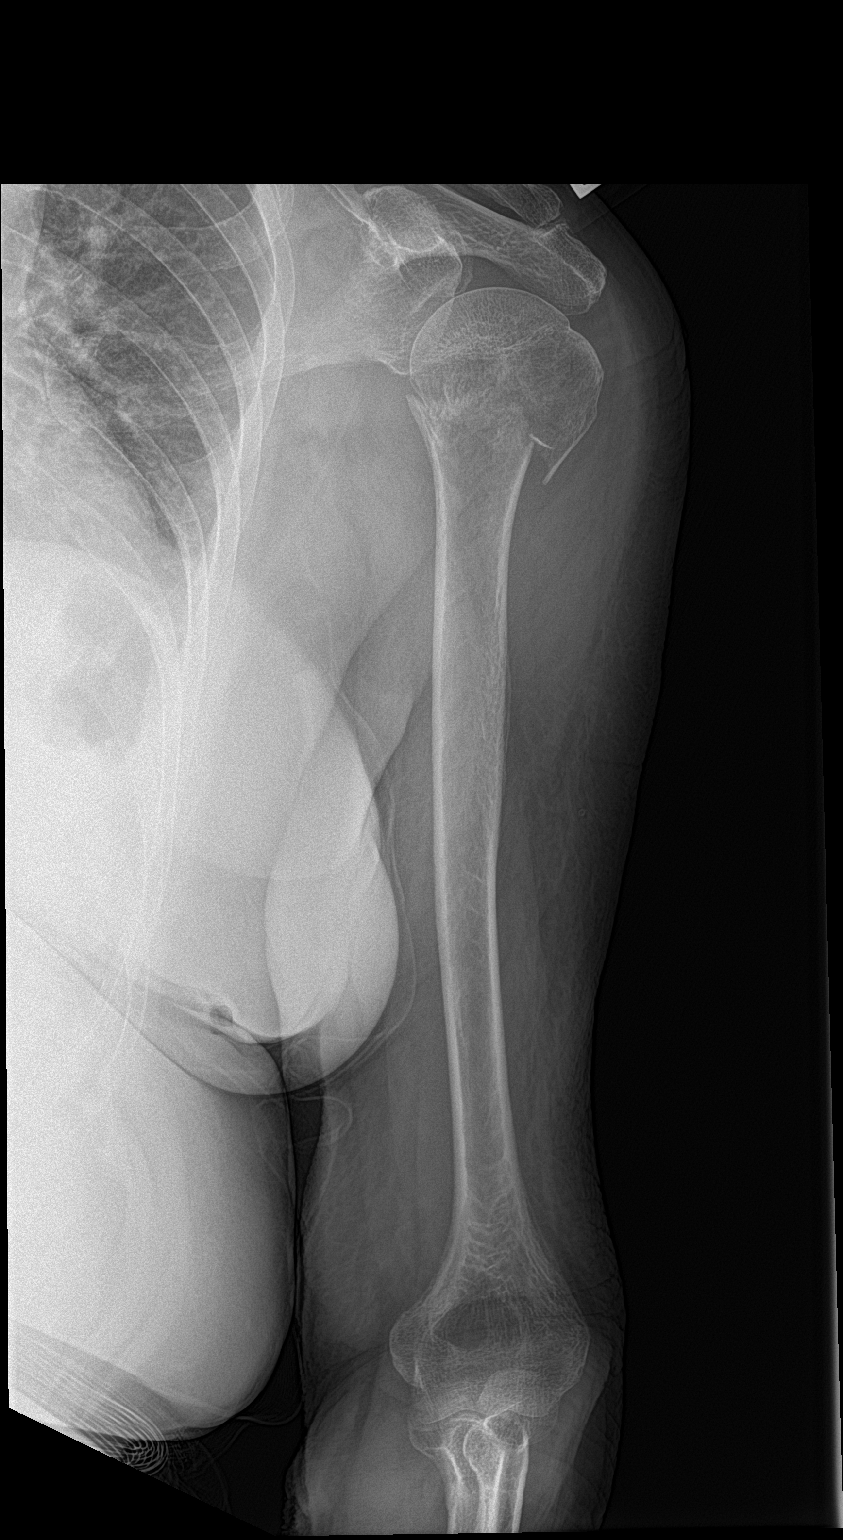

[humerus lat (2 of 2)]
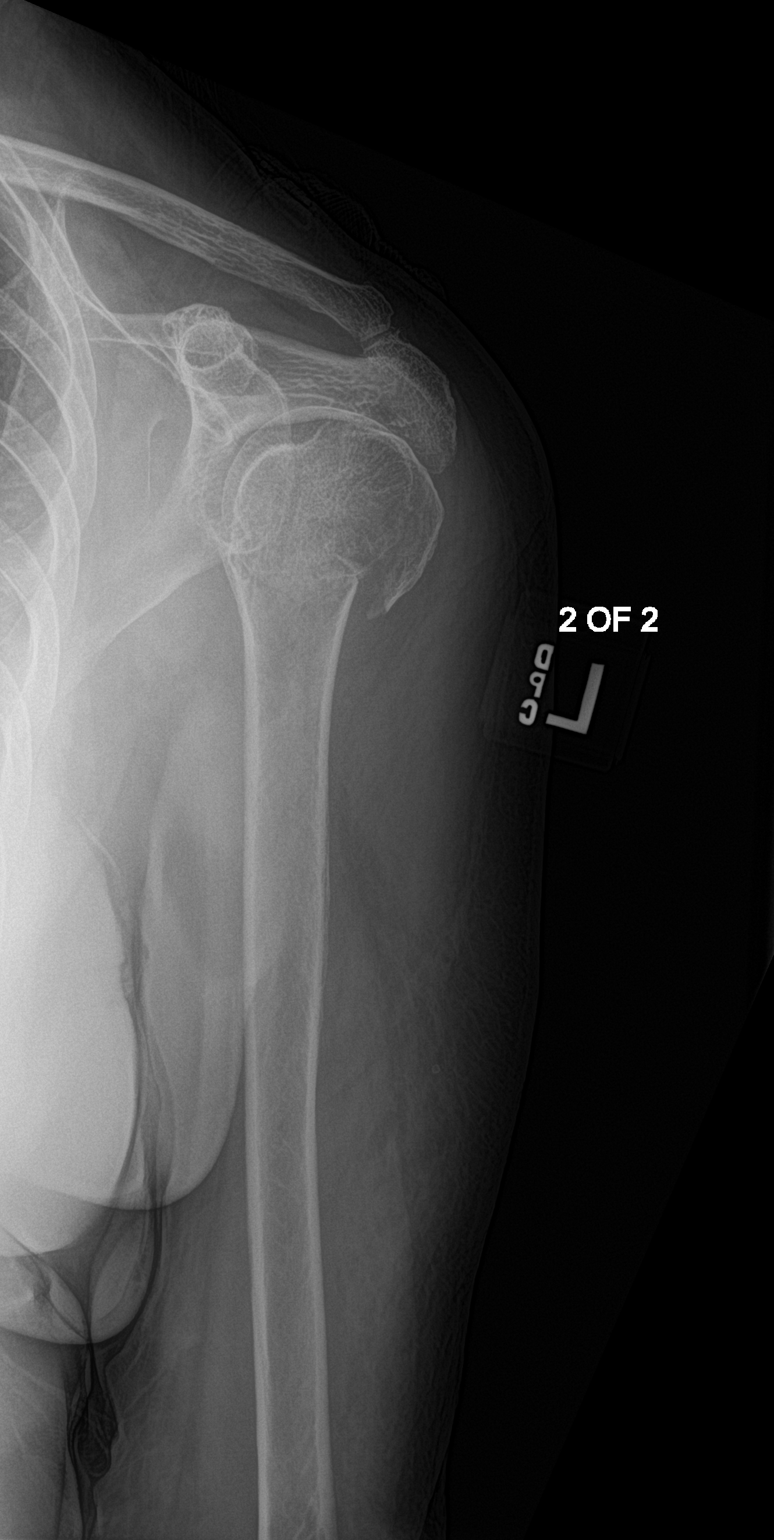

[3 of 3 positions shown; findings below may reference images not displayed]

FINDINGS: Fractures through the left humeral neck with mild displacement.
Normal alignment of the shoulder joint.
IMPRESSION: Fracture of the left humeral neck.

## 2018-02-07 DIAGNOSIS — E119 Type 2 diabetes mellitus without complications: Secondary | ICD-10-CM | POA: Diagnosis not present

## 2018-02-07 DIAGNOSIS — I10 Essential (primary) hypertension: Secondary | ICD-10-CM | POA: Diagnosis not present

## 2018-02-15 DIAGNOSIS — Z79899 Other long term (current) drug therapy: Secondary | ICD-10-CM | POA: Diagnosis not present

## 2018-02-15 DIAGNOSIS — Z7984 Long term (current) use of oral hypoglycemic drugs: Secondary | ICD-10-CM | POA: Diagnosis not present

## 2018-02-15 DIAGNOSIS — E785 Hyperlipidemia, unspecified: Secondary | ICD-10-CM | POA: Diagnosis not present

## 2018-02-15 DIAGNOSIS — E1140 Type 2 diabetes mellitus with diabetic neuropathy, unspecified: Secondary | ICD-10-CM | POA: Diagnosis not present

## 2018-02-15 DIAGNOSIS — I10 Essential (primary) hypertension: Secondary | ICD-10-CM | POA: Diagnosis not present

## 2018-05-10 DIAGNOSIS — E119 Type 2 diabetes mellitus without complications: Secondary | ICD-10-CM | POA: Diagnosis not present

## 2018-05-10 DIAGNOSIS — I10 Essential (primary) hypertension: Secondary | ICD-10-CM | POA: Diagnosis not present

## 2018-05-10 DIAGNOSIS — M810 Age-related osteoporosis without current pathological fracture: Secondary | ICD-10-CM | POA: Diagnosis not present

## 2018-05-10 DIAGNOSIS — E785 Hyperlipidemia, unspecified: Secondary | ICD-10-CM | POA: Diagnosis not present

## 2018-05-10 DIAGNOSIS — Z79899 Other long term (current) drug therapy: Secondary | ICD-10-CM | POA: Diagnosis not present

## 2018-05-17 DIAGNOSIS — Z7984 Long term (current) use of oral hypoglycemic drugs: Secondary | ICD-10-CM | POA: Diagnosis not present

## 2018-05-17 DIAGNOSIS — E1140 Type 2 diabetes mellitus with diabetic neuropathy, unspecified: Secondary | ICD-10-CM | POA: Diagnosis not present

## 2018-05-17 DIAGNOSIS — Z0001 Encounter for general adult medical examination with abnormal findings: Secondary | ICD-10-CM | POA: Diagnosis not present

## 2018-08-09 DIAGNOSIS — E1140 Type 2 diabetes mellitus with diabetic neuropathy, unspecified: Secondary | ICD-10-CM | POA: Diagnosis not present

## 2018-08-09 DIAGNOSIS — E785 Hyperlipidemia, unspecified: Secondary | ICD-10-CM | POA: Diagnosis not present

## 2018-08-09 DIAGNOSIS — I10 Essential (primary) hypertension: Secondary | ICD-10-CM | POA: Diagnosis not present

## 2018-08-18 DIAGNOSIS — E1140 Type 2 diabetes mellitus with diabetic neuropathy, unspecified: Secondary | ICD-10-CM | POA: Diagnosis not present

## 2018-08-18 DIAGNOSIS — Z7984 Long term (current) use of oral hypoglycemic drugs: Secondary | ICD-10-CM | POA: Diagnosis not present

## 2018-08-18 DIAGNOSIS — E785 Hyperlipidemia, unspecified: Secondary | ICD-10-CM | POA: Diagnosis not present

## 2018-08-18 DIAGNOSIS — M810 Age-related osteoporosis without current pathological fracture: Secondary | ICD-10-CM | POA: Diagnosis not present

## 2018-08-18 DIAGNOSIS — I10 Essential (primary) hypertension: Secondary | ICD-10-CM | POA: Diagnosis not present

## 2018-11-14 DIAGNOSIS — E785 Hyperlipidemia, unspecified: Secondary | ICD-10-CM | POA: Diagnosis not present

## 2018-11-14 DIAGNOSIS — I10 Essential (primary) hypertension: Secondary | ICD-10-CM | POA: Diagnosis not present

## 2018-11-14 DIAGNOSIS — Z79899 Other long term (current) drug therapy: Secondary | ICD-10-CM | POA: Diagnosis not present

## 2018-11-14 DIAGNOSIS — E119 Type 2 diabetes mellitus without complications: Secondary | ICD-10-CM | POA: Diagnosis not present

## 2018-11-21 DIAGNOSIS — I10 Essential (primary) hypertension: Secondary | ICD-10-CM | POA: Diagnosis not present

## 2018-11-21 DIAGNOSIS — E785 Hyperlipidemia, unspecified: Secondary | ICD-10-CM | POA: Diagnosis not present

## 2018-11-21 DIAGNOSIS — Z7984 Long term (current) use of oral hypoglycemic drugs: Secondary | ICD-10-CM | POA: Diagnosis not present

## 2018-11-21 DIAGNOSIS — Z79899 Other long term (current) drug therapy: Secondary | ICD-10-CM | POA: Diagnosis not present

## 2018-11-21 DIAGNOSIS — E1140 Type 2 diabetes mellitus with diabetic neuropathy, unspecified: Secondary | ICD-10-CM | POA: Diagnosis not present

## 2019-02-13 DIAGNOSIS — E1140 Type 2 diabetes mellitus with diabetic neuropathy, unspecified: Secondary | ICD-10-CM | POA: Diagnosis not present

## 2019-02-13 DIAGNOSIS — I10 Essential (primary) hypertension: Secondary | ICD-10-CM | POA: Diagnosis not present

## 2019-02-13 DIAGNOSIS — E785 Hyperlipidemia, unspecified: Secondary | ICD-10-CM | POA: Diagnosis not present

## 2019-02-20 DIAGNOSIS — I10 Essential (primary) hypertension: Secondary | ICD-10-CM | POA: Diagnosis not present

## 2019-02-20 DIAGNOSIS — Z79899 Other long term (current) drug therapy: Secondary | ICD-10-CM | POA: Diagnosis not present

## 2019-02-20 DIAGNOSIS — E1140 Type 2 diabetes mellitus with diabetic neuropathy, unspecified: Secondary | ICD-10-CM | POA: Diagnosis not present

## 2019-02-20 DIAGNOSIS — Z7984 Long term (current) use of oral hypoglycemic drugs: Secondary | ICD-10-CM | POA: Diagnosis not present

## 2019-02-20 DIAGNOSIS — E785 Hyperlipidemia, unspecified: Secondary | ICD-10-CM | POA: Diagnosis not present

## 2019-05-17 DIAGNOSIS — Z79899 Other long term (current) drug therapy: Secondary | ICD-10-CM | POA: Diagnosis not present

## 2019-05-17 DIAGNOSIS — E11311 Type 2 diabetes mellitus with unspecified diabetic retinopathy with macular edema: Secondary | ICD-10-CM | POA: Diagnosis not present

## 2019-05-17 DIAGNOSIS — E785 Hyperlipidemia, unspecified: Secondary | ICD-10-CM | POA: Diagnosis not present

## 2019-05-17 DIAGNOSIS — I10 Essential (primary) hypertension: Secondary | ICD-10-CM | POA: Diagnosis not present

## 2020-07-23 ENCOUNTER — Other Ambulatory Visit (HOSPITAL_COMMUNITY): Payer: Self-pay | Admitting: Family Medicine

## 2020-07-23 DIAGNOSIS — M810 Age-related osteoporosis without current pathological fracture: Secondary | ICD-10-CM

## 2020-08-01 ENCOUNTER — Ambulatory Visit (HOSPITAL_COMMUNITY)
Admission: RE | Admit: 2020-08-01 | Discharge: 2020-08-01 | Disposition: A | Discharge status: Home / Self Care | Payer: Medicare Other | Source: Ambulatory Visit | Attending: Family Medicine | Admitting: Family Medicine

## 2020-08-01 ENCOUNTER — Other Ambulatory Visit: Payer: Self-pay

## 2020-08-01 DIAGNOSIS — M810 Age-related osteoporosis without current pathological fracture: Secondary | ICD-10-CM | POA: Insufficient documentation

## 2022-08-05 ENCOUNTER — Other Ambulatory Visit (HOSPITAL_COMMUNITY): Payer: Self-pay | Admitting: Family Medicine

## 2022-08-05 DIAGNOSIS — M810 Age-related osteoporosis without current pathological fracture: Secondary | ICD-10-CM

## 2022-11-27 ENCOUNTER — Encounter (HOSPITAL_COMMUNITY): Payer: Self-pay

## 2022-11-27 ENCOUNTER — Emergency Department (HOSPITAL_COMMUNITY): Payer: Medicare Other

## 2022-11-27 ENCOUNTER — Other Ambulatory Visit: Payer: Self-pay

## 2022-11-27 ENCOUNTER — Inpatient Hospital Stay (HOSPITAL_COMMUNITY)
Admission: EM | Admit: 2022-11-27 | Discharge: 2022-12-04 | DRG: 280 | Disposition: A | Discharge status: Hospice | Payer: Medicare Other | Attending: Internal Medicine | Admitting: Internal Medicine

## 2022-11-27 DIAGNOSIS — E1140 Type 2 diabetes mellitus with diabetic neuropathy, unspecified: Secondary | ICD-10-CM | POA: Diagnosis present

## 2022-11-27 DIAGNOSIS — I4891 Unspecified atrial fibrillation: Secondary | ICD-10-CM | POA: Diagnosis present

## 2022-11-27 DIAGNOSIS — E871 Hypo-osmolality and hyponatremia: Secondary | ICD-10-CM | POA: Diagnosis present

## 2022-11-27 DIAGNOSIS — I214 Non-ST elevation (NSTEMI) myocardial infarction: Secondary | ICD-10-CM | POA: Diagnosis not present

## 2022-11-27 DIAGNOSIS — Z885 Allergy status to narcotic agent status: Secondary | ICD-10-CM | POA: Diagnosis not present

## 2022-11-27 DIAGNOSIS — J9601 Acute respiratory failure with hypoxia: Secondary | ICD-10-CM | POA: Diagnosis present

## 2022-11-27 DIAGNOSIS — H3530 Unspecified macular degeneration: Secondary | ICD-10-CM | POA: Diagnosis present

## 2022-11-27 DIAGNOSIS — E872 Acidosis, unspecified: Secondary | ICD-10-CM | POA: Diagnosis not present

## 2022-11-27 DIAGNOSIS — I110 Hypertensive heart disease with heart failure: Secondary | ICD-10-CM | POA: Diagnosis present

## 2022-11-27 DIAGNOSIS — K219 Gastro-esophageal reflux disease without esophagitis: Secondary | ICD-10-CM | POA: Diagnosis present

## 2022-11-27 DIAGNOSIS — Z66 Do not resuscitate: Secondary | ICD-10-CM | POA: Diagnosis present

## 2022-11-27 DIAGNOSIS — N179 Acute kidney failure, unspecified: Secondary | ICD-10-CM | POA: Diagnosis present

## 2022-11-27 DIAGNOSIS — I5021 Acute systolic (congestive) heart failure: Secondary | ICD-10-CM | POA: Diagnosis present

## 2022-11-27 DIAGNOSIS — E1122 Type 2 diabetes mellitus with diabetic chronic kidney disease: Secondary | ICD-10-CM | POA: Diagnosis present

## 2022-11-27 DIAGNOSIS — I255 Ischemic cardiomyopathy: Secondary | ICD-10-CM | POA: Diagnosis present

## 2022-11-27 DIAGNOSIS — Z7189 Other specified counseling: Secondary | ICD-10-CM | POA: Diagnosis not present

## 2022-11-27 DIAGNOSIS — E7800 Pure hypercholesterolemia, unspecified: Secondary | ICD-10-CM | POA: Diagnosis present

## 2022-11-27 DIAGNOSIS — E119 Type 2 diabetes mellitus without complications: Secondary | ICD-10-CM

## 2022-11-27 DIAGNOSIS — Z515 Encounter for palliative care: Secondary | ICD-10-CM | POA: Diagnosis not present

## 2022-11-27 DIAGNOSIS — I959 Hypotension, unspecified: Secondary | ICD-10-CM | POA: Diagnosis not present

## 2022-11-27 DIAGNOSIS — Z8249 Family history of ischemic heart disease and other diseases of the circulatory system: Secondary | ICD-10-CM

## 2022-11-27 DIAGNOSIS — Z880 Allergy status to penicillin: Secondary | ICD-10-CM

## 2022-11-27 DIAGNOSIS — Z7984 Long term (current) use of oral hypoglycemic drugs: Secondary | ICD-10-CM | POA: Diagnosis not present

## 2022-11-27 DIAGNOSIS — E875 Hyperkalemia: Secondary | ICD-10-CM | POA: Diagnosis present

## 2022-11-27 DIAGNOSIS — D631 Anemia in chronic kidney disease: Secondary | ICD-10-CM | POA: Diagnosis present

## 2022-11-27 DIAGNOSIS — E874 Mixed disorder of acid-base balance: Secondary | ICD-10-CM | POA: Diagnosis present

## 2022-11-27 DIAGNOSIS — E782 Mixed hyperlipidemia: Secondary | ICD-10-CM | POA: Diagnosis present

## 2022-11-27 DIAGNOSIS — Z79899 Other long term (current) drug therapy: Secondary | ICD-10-CM | POA: Diagnosis not present

## 2022-11-27 DIAGNOSIS — I10 Essential (primary) hypertension: Secondary | ICD-10-CM | POA: Diagnosis not present

## 2022-11-27 DIAGNOSIS — E1165 Type 2 diabetes mellitus with hyperglycemia: Secondary | ICD-10-CM | POA: Diagnosis present

## 2022-11-27 DIAGNOSIS — Z1152 Encounter for screening for COVID-19: Secondary | ICD-10-CM

## 2022-11-27 DIAGNOSIS — H9190 Unspecified hearing loss, unspecified ear: Secondary | ICD-10-CM | POA: Diagnosis present

## 2022-11-27 HISTORY — DX: Unspecified macular degeneration: H35.30

## 2022-11-27 HISTORY — DX: Type 2 diabetes mellitus without complications: E11.9

## 2022-11-27 HISTORY — DX: Unspecified hearing loss, unspecified ear: H91.90

## 2022-11-27 LAB — RESP PANEL BY RT-PCR (RSV, FLU A&B, COVID)  RVPGX2
Influenza A by PCR: NEGATIVE
Influenza B by PCR: NEGATIVE
Resp Syncytial Virus by PCR: NEGATIVE
SARS Coronavirus 2 by RT PCR: NEGATIVE

## 2022-11-27 LAB — COMPREHENSIVE METABOLIC PANEL
ALT: 17 U/L (ref 0–44)
AST: 20 U/L (ref 15–41)
Albumin: 3.5 g/dL (ref 3.5–5.0)
Alkaline Phosphatase: 42 U/L (ref 38–126)
Anion gap: 13 (ref 5–15)
BUN: 30 mg/dL — ABNORMAL HIGH (ref 8–23)
CO2: 15 mmol/L — ABNORMAL LOW (ref 22–32)
Calcium: 8.6 mg/dL — ABNORMAL LOW (ref 8.9–10.3)
Chloride: 93 mmol/L — ABNORMAL LOW (ref 98–111)
Creatinine, Ser: 1.27 mg/dL — ABNORMAL HIGH (ref 0.44–1.00)
GFR, Estimated: 42 mL/min — ABNORMAL LOW (ref >60)
Glucose, Bld: 160 mg/dL — ABNORMAL HIGH (ref 70–99)
Potassium: 4.8 mmol/L (ref 3.5–5.1)
Sodium: 121 mmol/L — ABNORMAL LOW (ref 135–145)
Total Bilirubin: 0.7 mg/dL (ref <1.2)
Total Protein: 7.1 g/dL (ref 6.5–8.1)

## 2022-11-27 LAB — TROPONIN I (HIGH SENSITIVITY)
Troponin I (High Sensitivity): 1849 ng/L (ref <18)
Troponin I (High Sensitivity): 1450 ng/L (ref <18)
Troponin I (High Sensitivity): 1684 ng/L (ref <18)

## 2022-11-27 LAB — CBC
HCT: 29.1 % — ABNORMAL LOW (ref 36.0–46.0)
Hemoglobin: 9.8 g/dL — ABNORMAL LOW (ref 12.0–15.0)
MCH: 30.5 pg (ref 26.0–34.0)
MCHC: 33.7 g/dL (ref 30.0–36.0)
MCV: 90.7 fL (ref 80.0–100.0)
Platelets: 244 10*3/uL (ref 150–400)
RBC: 3.21 MIL/uL — ABNORMAL LOW (ref 3.87–5.11)
RDW: 14.2 % (ref 11.5–15.5)
WBC: 8.5 10*3/uL (ref 4.0–10.5)
nRBC: 0 % (ref 0.0–0.2)

## 2022-11-27 LAB — BASIC METABOLIC PANEL
Anion gap: 9 (ref 5–15)
BUN: 28 mg/dL — ABNORMAL HIGH (ref 8–23)
CO2: 19 mmol/L — ABNORMAL LOW (ref 22–32)
Calcium: 8.5 mg/dL — ABNORMAL LOW (ref 8.9–10.3)
Chloride: 93 mmol/L — ABNORMAL LOW (ref 98–111)
Creatinine, Ser: 1.17 mg/dL — ABNORMAL HIGH (ref 0.44–1.00)
GFR, Estimated: 46 mL/min — ABNORMAL LOW (ref >60)
Glucose, Bld: 127 mg/dL — ABNORMAL HIGH (ref 70–99)
Potassium: 5 mmol/L (ref 3.5–5.1)
Sodium: 121 mmol/L — ABNORMAL LOW (ref 135–145)

## 2022-11-27 LAB — HEMOGLOBIN A1C
Hgb A1c MFr Bld: 6.8 % — ABNORMAL HIGH (ref 4.8–5.6)
Mean Plasma Glucose: 148.46 mg/dL

## 2022-11-27 LAB — BRAIN NATRIURETIC PEPTIDE: B Natriuretic Peptide: 1369 pg/mL — ABNORMAL HIGH (ref 0.0–100.0)

## 2022-11-27 LAB — GLUCOSE, CAPILLARY: Glucose-Capillary: 116 mg/dL — ABNORMAL HIGH (ref 70–99)

## 2022-11-27 MED ORDER — ACETAMINOPHEN 650 MG RE SUPP: 650.0000 mg | Freq: Four times a day (QID) | RECTAL | Status: DC | PRN

## 2022-11-27 MED ORDER — POTASSIUM CHLORIDE CRYS ER 20 MEQ PO TBCR
40.0000 meq | EXTENDED_RELEASE_TABLET | Freq: Two times a day (BID) | ORAL | Status: DC
  Administered 2022-11-27: 40 meq via ORAL
  Filled 2022-11-27: qty 2

## 2022-11-27 MED ORDER — ACETAMINOPHEN 325 MG PO TABS
650.0000 mg | ORAL_TABLET | Freq: Four times a day (QID) | ORAL | Status: DC | PRN
  Administered 2022-11-28: 650 mg via ORAL
  Filled 2022-11-27: qty 2

## 2022-11-27 MED ORDER — ONDANSETRON HCL 4 MG/2ML IJ SOLN
4.0000 mg | Freq: Four times a day (QID) | INTRAMUSCULAR | Status: DC | PRN
  Administered 2022-11-28: 4 mg via INTRAVENOUS
  Filled 2022-11-27: qty 2

## 2022-11-27 MED ORDER — HEPARIN (PORCINE) 25000 UT/250ML-% IV SOLN
1000.0000 [IU]/h | INTRAVENOUS | Status: DC
  Administered 2022-11-27: 900 [IU]/h via INTRAVENOUS
  Administered 2022-11-28 – 2022-12-01 (×4): 1000 [IU]/h via INTRAVENOUS
  Filled 2022-11-27 (×5): qty 250

## 2022-11-27 MED ORDER — ONDANSETRON HCL 4 MG PO TABS: 4.0000 mg | ORAL_TABLET | Freq: Four times a day (QID) | ORAL | Status: DC | PRN

## 2022-11-27 MED ORDER — POLYETHYLENE GLYCOL 3350 17 G PO PACK
17.0000 g | PACK | Freq: Every day | ORAL | Status: DC | PRN
  Filled 2022-11-27: qty 1

## 2022-11-27 MED ORDER — FUROSEMIDE 10 MG/ML IJ SOLN
40.0000 mg | Freq: Two times a day (BID) | INTRAMUSCULAR | Status: DC
  Administered 2022-11-28 – 2022-11-29 (×3): 40 mg via INTRAVENOUS
  Filled 2022-11-27 (×5): qty 4

## 2022-11-27 MED ORDER — SIMVASTATIN 20 MG PO TABS
40.0000 mg | ORAL_TABLET | Freq: Every evening | ORAL | Status: DC
  Administered 2022-11-27 – 2022-12-02 (×6): 40 mg via ORAL
  Filled 2022-11-27 (×6): qty 2

## 2022-11-27 MED ORDER — GABAPENTIN 300 MG PO CAPS
300.0000 mg | ORAL_CAPSULE | Freq: Three times a day (TID) | ORAL | Status: DC
  Administered 2022-11-27 – 2022-12-03 (×16): 300 mg via ORAL
  Filled 2022-11-27 (×16): qty 1

## 2022-11-27 MED ORDER — INSULIN ASPART 100 UNIT/ML IJ SOLN
0.0000 [IU] | Freq: Every day | INTRAMUSCULAR | Status: DC
Start: 2022-11-27 — End: 2022-12-03
  Administered 2022-12-01 – 2022-12-02 (×2): 2 [IU] via SUBCUTANEOUS

## 2022-11-27 MED ORDER — INSULIN ASPART 100 UNIT/ML IJ SOLN
0.0000 [IU] | Freq: Three times a day (TID) | INTRAMUSCULAR | Status: DC
  Administered 2022-11-28: 5 [IU] via SUBCUTANEOUS
  Administered 2022-11-28: 2 [IU] via SUBCUTANEOUS
  Administered 2022-11-29: 3 [IU] via SUBCUTANEOUS
  Administered 2022-11-29: 6 [IU] via SUBCUTANEOUS
  Administered 2022-11-30 (×3): 2 [IU] via SUBCUTANEOUS
  Administered 2022-12-01 – 2022-12-02 (×3): 3 [IU] via SUBCUTANEOUS
  Administered 2022-12-02: 5 [IU] via SUBCUTANEOUS
  Administered 2022-12-02: 3 [IU] via SUBCUTANEOUS
  Administered 2022-12-03: 5 [IU] via SUBCUTANEOUS
  Administered 2022-12-03 (×2): 3 [IU] via SUBCUTANEOUS
  Administered 2022-12-04: 5 [IU] via SUBCUTANEOUS

## 2022-11-27 MED ORDER — LOSARTAN POTASSIUM 50 MG PO TABS: 50.0000 mg | ORAL_TABLET | Freq: Every day | ORAL | Status: DC

## 2022-11-27 MED ORDER — FUROSEMIDE 10 MG/ML IJ SOLN
20.0000 mg | Freq: Once | INTRAMUSCULAR | Status: AC
  Administered 2022-11-27: 20 mg via INTRAVENOUS
  Filled 2022-11-27: qty 2

## 2022-11-27 MED ORDER — HEPARIN BOLUS VIA INFUSION
4000.0000 [IU] | Freq: Once | INTRAVENOUS | Status: AC
  Administered 2022-11-27: 4000 [IU] via INTRAVENOUS

## 2022-11-27 NOTE — ED Notes (Signed)
Placed Debra Grimes on 2L O2 via [place] secondary to a mildly low O2 sat.  Debra Grimes satting 95% on 2L via [place].

## 2022-11-27 NOTE — Consult Note (Signed)
Cardiology Consultation:   Patient ID: Debra Grimes; [id number]; [birth year]   Admit date: 11/27/2022 Date of Consult: 11/27/2022  Primary Care Provider: Ponciano Grimes [facility] Primary Cardiologist: New to [facility]  History of Present Illness:   [name] is an 82 y.o. female with past medical history outlined below now presenting to the [facility] ER for evaluation of dyspnea on exertion, exertional fatigue, and intermittent epigastric discomfort that began 2 days ago suddenly.  She has macular degeneration and hearing loss, but lives in her own home and takes care of all of her ADLs.  Her two daughters present indicate that generally she does well.  One of her daughters noticed that her mother was more short of breath than normal today and had leg swelling resulting in ER evaluation.  Chest x-ray shows cardiomegaly with bilateral small pleural effusions and interstitial edema.  Initial lab work shows BNP of 1369, high-sensitivity troponin I of 1849, sodium of 121, creatinine 1.27, and hemoglobin 9.8.  Viral respiratory panel is negative.  ECG shows sinus rhythm with IVCD, poor R wave progression, possible recent or subacute inferior infarct pattern.  ROS:  Pertinent review in history of present illness.  Chronic hearing loss, impaired vision.  No palpitations or syncope.  Leg swelling has been recent in onset.  No orthopnea or PND.  Past Medical History:  Diagnosis Date   Arthritis    Hearing loss    Hypercholesteremia    Hypertension    Macular degeneration    Type 2 diabetes mellitus [facility])     Past Surgical History:  Procedure Laterality Date   CATARACT EXTRACTION W/PHACO Right 08/27/2014   Procedure: CATARACT EXTRACTION PHACO AND INTRAOCULAR LENS PLACEMENT (IOC);  Surgeon: Debra Simmonds, MD;  Location: [facility];  Service: Ophthalmology;  Laterality: Right;  CDE: 33.79   CATARACT EXTRACTION W/PHACO Left 12/31/2014   Procedure: CATARACT EXTRACTION PHACO  AND INTRAOCULAR LENS PLACEMENT (IOC);  Surgeon: Debra Simmonds, MD;  Location: [facility];  Service: Ophthalmology;  Laterality: Left;  CDE 8.06   ORIF ANKLE FRACTURE Left    PATELLA FRACTURE SURGERY Right      Outpatient Medications: No current facility-administered medications on file prior to encounter.   Current Outpatient Medications on File Prior to Encounter  Medication Sig Dispense Refill   amLODipine (NORVASC) 5 MG tablet Take 5 mg by mouth daily.     Calcium Citrate-Vitamin D (CITRACAL + D PO) Take 1 tablet by mouth daily.     gabapentin (NEURONTIN) 300 MG capsule Take 300 mg by mouth 3 (three) times daily.     glimepiride (AMARYL) 4 MG tablet Take 4 mg by mouth 2 (two) times daily.     losartan (COZAAR) 100 MG tablet Take 100 mg by mouth daily.     metFORMIN (GLUCOPHAGE) 1000 MG tablet Take 1,000 mg by mouth 2 (two) times daily with a meal.     risedronate (ACTONEL) 150 MG tablet Take 150 mg by mouth every 30 (thirty) days. Takes on the 19th of the month. with water on empty stomach, nothing by mouth or lie down for next 30 minutes.     simvastatin (ZOCOR) 40 MG tablet Take 40 mg by mouth every evening.       Allergies:    Allergies  Allergen Reactions   Hydrocodone    Oxycodone-Acetaminophen    Penicillins     Social History:   Social History   Tobacco Use   Smoking status: Never  Smokeless tobacco: Never  Substance Use Topics   Alcohol use: No    Family History:   The patient's family history includes Hypertension in her father.  Physical Exam/Data:   Vitals:   11/27/22 1415 11/27/22 1430 11/27/22 1445 11/27/22 1500  BP: 110/64 108/61 107/62 108/67  Pulse: 88 83 82 85  Resp: 13 19 17  (!) 21  Temp:      TempSrc:      SpO2: 96% 96% 96% 95%  Weight:      Height:       No intake or output data in the 24 hours ending 11/27/22 1614 Filed Weights   11/27/22 1337  Weight: 76.2 kg   Body mass index is 29.78 kg/m.   Gen: Elderly woman, appears  comfortable at rest. HEENT: Conjunctiva and lids normal, oropharynx clear. Neck: Supple, no elevated JVP or carotid bruits. Lungs: Decreased breath sounds with crackles at the bases, nonlabored. Cardiac: Regular rate and rhythm, no S3, 1/6 systolic murmur, no pericardial rub. Abdomen: Soft, nontender, bowel sounds present. Extremities: 2+ pitting edema lower legs, distal pulses 2+. Skin: Warm and dry. Musculoskeletal: No kyphosis. Neuropsychiatric: Alert and oriented x3, affect grossly appropriate.  Telemetry:  I personally reviewed telemetry which shows sinus rhythm.  Laboratory Data:  Chemistry Recent Labs  Lab 11/27/22 1400  NA 121*  K 4.8  CL 93*  CO2 15*  GLUCOSE 160*  BUN 30*  CREATININE 1.27*  CALCIUM 8.6*  GFRNONAA 42*  ANIONGAP 13    Recent Labs  Lab 11/27/22 1400  PROT 7.1  ALBUMIN 3.5  AST 20  ALT 17  ALKPHOS 42  BILITOT 0.7   Hematology Recent Labs  Lab 11/27/22 1400  WBC 8.5  RBC 3.21*  HGB 9.8*  HCT 29.1*  MCV 90.7  MCH 30.5  MCHC 33.7  RDW 14.2  PLT 244   Cardiac Enzymes Recent Labs  Lab 11/27/22 1406  TROPONINIHS 1,849*   BNP Recent Labs  Lab 11/27/22 1401  BNP 1,369.0*    Radiology/Studies:  No results found.  Assessment and Plan:   1.  Recent onset dyspnea on exertion, exertional fatigue, and intermittent epigastric discomfort with symptom onset suddenly within the last 48 hours.  ECG abnormal with poor R wave progression and possible recent or subacute inferior infarct pattern.  Patient reports no active symptoms at rest.  High-sensitivity troponin I 1849 consistent with NSTEMI and BNP also elevated at 1369 with small pleural effusions and interstitial edema evident by chest x-ray raising likelihood of ischemic cardiomyopathy.  2.  Primary hypertension, on Cozaar and Norvasc as an outpatient.  3.  Mixed hyperlipidemia on Zocor 40 mg daily.  Recent LDL 45.  4.  Type 2 diabetes mellitus, on Glucophage and Amaryl as an  outpatient.  Recent hemoglobin A1c 6.8%.  5.  Anemia, hemoglobin 9.8 with normal MCV.  Looks to be chronic based on outside lab work, etiology not clear.  6.  Hyponatremia, sodium 121 (glucose only 160).  Presumably recent onset with sodium 137 in August.  Records reviewed and situation discussed with the patient and her 2 daughters at bedside.  Recommending admission to the hospitalist service at [facility] with continued follow-up by our cardiology team.  She currently has evidence of NSTEMI, possibly recent inferior infarct and subsequent ischemic cardiomyopathy.  She has been started on IV heparin by ER team, need to follow hemoglobin carefully and would guaiac stools as well.  Initiate aspirin, continue Zocor, hold off on Glucophage, may also want  to ultimately switch Amaryl to an SGLT2 inhibitor prior to hospital discharge.  Would hold Norvasc and continue Cozaar for now.  Addition of low-dose beta-blocker could come next depending on her blood pressure and heart rate.  She will need an echocardiogram and likely diagnostic cardiac catheterization during hospital stay.  Our service will continue to follow with you.  For questions or updates, please contact [facility] Please consult [email or web address] for contact info under   Signed, Nona Dell, MD  11/27/2022 4:14 PM

## 2022-11-27 NOTE — ED Notes (Signed)
Report to care link, pt has no requests at this time, pt from department.

## 2022-11-27 NOTE — ED Provider Notes (Signed)
[place] EMERGENCY DEPARTMENT AT [facility] Provider Note   CSN: [id number] Arrival date & time: 11/27/22  1328     History {Add pertinent medical, surgical, social history, OB history to HPI:1} Chief Complaint  Patient presents with   Shortness of Breath    Debra Grimes is a 82 y.o. female.  HPI Patient presents with 2 daughters who assist with history.  She presents due to concern of several days of feeling generally poor.  She has dyspnea as well.  No pain, specifically no chest pain, no headache, no syncope, no fever, no vomiting.  Patient has been taking her medication as directed. There is new lower extremity edema.    Home Medications Prior to Admission medications   Medication Sig Start Date End Date Taking? Authorizing Provider  amLODipine (NORVASC) 5 MG tablet Take 5 mg by mouth daily.   Yes [provider]  Calcium Citrate-Vitamin D (CITRACAL + D PO) Take 1 tablet by mouth daily.   Yes [provider]  gabapentin (NEURONTIN) 300 MG capsule Take 300 mg by mouth 3 (three) times daily.   Yes [provider]  glimepiride (AMARYL) 4 MG tablet Take 4 mg by mouth 2 (two) times daily.   Yes [provider]  losartan (COZAAR) 100 MG tablet Take 100 mg by mouth daily.   Yes [provider]  metFORMIN (GLUCOPHAGE) 1000 MG tablet Take 1,000 mg by mouth 2 (two) times daily with a meal.   Yes [provider]  risedronate (ACTONEL) 150 MG tablet Take 150 mg by mouth every 30 (thirty) days. Takes on the 19th of the month. with water on empty stomach, nothing by mouth or lie down for next 30 minutes.   Yes [provider]  simvastatin (ZOCOR) 40 MG tablet Take 40 mg by mouth every evening.   Yes [provider]      Allergies    Hydrocodone, Oxycodone-acetaminophen, and Penicillins    Review of Systems   Review of Systems  Physical Exam Updated Vital Signs BP 108/67   Pulse 85   Temp 98 F  (36.7 C) (Oral)   Resp (!) 21   Ht 5\' 3"  (1.6 m)   Wt 76.2 kg   SpO2 95%   BMI 29.78 kg/m  Physical Exam Vitals and nursing note reviewed.  Constitutional:      General: She is not in acute distress.    Appearance: She is well-developed. She is ill-appearing. She is not toxic-appearing.  HENT:     Head: Normocephalic and atraumatic.  Eyes:     Conjunctiva/sclera: Conjunctivae normal.  Cardiovascular:     Rate and Rhythm: Normal rate and regular rhythm.  Pulmonary:     Effort: Pulmonary effort is normal. No respiratory distress.     Breath sounds: Normal breath sounds. No stridor.  Abdominal:     General: There is no distension.  Musculoskeletal:     Right lower leg: Edema present.     Left lower leg: Edema present.  Skin:    General: Skin is warm and dry.  Neurological:     Mental Status: She is alert and oriented to person, place, and time.     Cranial Nerves: No cranial nerve deficit.  Psychiatric:        Mood and Affect: Mood normal.     ED Results / Procedures / Treatments   Labs (all labs ordered are listed, but only abnormal results are displayed) Labs Reviewed  COMPREHENSIVE METABOLIC  PANEL - Abnormal; Notable for the following components:      Result Value   Sodium 121 (*)    Chloride 93 (*)    CO2 15 (*)    Glucose, Bld 160 (*)    BUN 30 (*)    Creatinine, Ser 1.27 (*)    Calcium 8.6 (*)    GFR, Estimated 42 (*)    All other components within normal limits  CBC - Abnormal; Notable for the following components:   RBC 3.21 (*)    Hemoglobin 9.8 (*)    HCT 29.1 (*)    All other components within normal limits  RESP PANEL BY RT-PCR (RSV, FLU A&B, COVID)  [id number]  BRAIN NATRIURETIC PEPTIDE  TROPONIN I (HIGH SENSITIVITY)    EKG None  Radiology No results found.  Procedures Procedures  {Document cardiac monitor, telemetry assessment procedure when appropriate:1}  Medications Ordered in ED Medications - No data to display  ED Course/ Medical  Decision Making/ A&P   {   Click here for ABCD2, HEART and other calculatorsREFRESH Note before signing :1}                              Medical Decision Making Adult female presents with fatigue, dyspnea, new lower extremity edema over several days.  She has no chest pain, no syncope, no fever, reassuring for low suspicion for ACS, pneumonia, bacteremia.  Findings concerning for heart failure, versus other hepatic or renal dysfunction with electrolyte abnormalities, though infection is a possibility. Patient started on continuous monitoring, labs x-ray EKG all ordered. Cardiac 85 sinus normal Pulse ox 95% room air normal   Amount and/or Complexity of Data Reviewed Independent Historian:     Details: 2 daughters at bedside External Data Reviewed: notes. Labs: ordered. Decision-making details documented in ED Course. Radiology: ordered and independent interpretation performed. Decision-making details documented in ED Course. ECG/medicine tests: ordered and independent interpretation performed. Decision-making details documented in ED Course.  Risk Prescription drug management. Decision regarding hospitalization. Diagnosis or treatment significantly limited by social determinants of health.   ***  {Document critical care time when appropriate:1} {Document review of labs and clinical decision tools ie heart score, Chads2Vasc2 etc:1}  {Document your independent review of radiology images, and any outside records:1} {Document your discussion with family members, caretakers, and with consultants:1} {Document social determinants of health affecting pt's care:1} {Document your decision making why or why not admission, treatments were needed:1} Final Clinical Impression(s) / ED Diagnoses Final diagnoses:  None    Rx / DC Orders ED Discharge Orders     None

## 2022-11-27 NOTE — ED Triage Notes (Signed)
Pt c/o swelling in both her legs and increased shortness of breath for a couple of days. Pt states she has not been feeling well past couple of days.

## 2022-11-27 NOTE — H&P (Signed)
History and Physical    Patient: Debra Grimes XBJ:[id number] DOB: [birth year] DOA: 11/27/2022 DOS: the patient was seen and examined on 11/27/2022 PCP: [facility], [facility]  Patient coming from: Home  Chief Complaint:  Chief Complaint  Patient presents with   Shortness of Breath   HPI: Debra Grimes is a 82 y.o. female with medical history significant of diabetes, hypertension, hyperlipidemia.  She presents with several days of shortness of breath and generally feeling fairly poor.  Her symptoms have been worsening.  She denies chest pain, syncope, nausea, vomiting.  She reports taking her medications as prescribed.  She does have increased peripheral edema in her lower extremities.  Review of Systems: As mentioned in the history of present illness. All other systems reviewed and are negative. Past Medical History:  Diagnosis Date   Arthritis    Diabetes mellitus without complication (HCC)    Hypercholesteremia    Hypertension    Past Surgical History:  Procedure Laterality Date   CATARACT EXTRACTION W/PHACO Right 08/27/2014   Procedure: CATARACT EXTRACTION PHACO AND INTRAOCULAR LENS PLACEMENT (IOC);  Surgeon: Susa Simmonds, MD;  Location: [facility];  Service: Ophthalmology;  Laterality: Right;  CDE: 33.79   CATARACT EXTRACTION W/PHACO Left 12/31/2014   Procedure: CATARACT EXTRACTION PHACO AND INTRAOCULAR LENS PLACEMENT (IOC);  Surgeon: Susa Simmonds, MD;  Location: [facility];  Service: Ophthalmology;  Laterality: Left;  CDE 8.06   ORIF ANKLE FRACTURE Left    PATELLA FRACTURE SURGERY Right    Social History:  reports that she has never smoked. She has never used smokeless tobacco. She reports that she does not drink alcohol and does not use drugs.  Allergies  Allergen Reactions   Hydrocodone    Oxycodone-Acetaminophen    Penicillins     History reviewed. No pertinent family history.  Prior to Admission medications   Medication Sig Start Date End Date Taking?  Authorizing Provider  amLODipine (NORVASC) 5 MG tablet Take 5 mg by mouth daily.   Yes [provider]  Calcium Citrate-Vitamin D (CITRACAL + D PO) Take 1 tablet by mouth daily.   Yes [provider]  gabapentin (NEURONTIN) 300 MG capsule Take 300 mg by mouth 3 (three) times daily.   Yes [provider]  glimepiride (AMARYL) 4 MG tablet Take 4 mg by mouth 2 (two) times daily.   Yes [provider]  losartan (COZAAR) 100 MG tablet Take 100 mg by mouth daily.   Yes [provider]  metFORMIN (GLUCOPHAGE) 1000 MG tablet Take 1,000 mg by mouth 2 (two) times daily with a meal.   Yes [provider]  risedronate (ACTONEL) 150 MG tablet Take 150 mg by mouth every 30 (thirty) days. Takes on the 19th of the month. with water on empty stomach, nothing by mouth or lie down for next 30 minutes.   Yes [provider]  simvastatin (ZOCOR) 40 MG tablet Take 40 mg by mouth every evening.   Yes [provider]    Physical Exam: Vitals:   11/27/22 1415 11/27/22 1430 11/27/22 1445 11/27/22 1500  BP: 110/64 108/61 107/62 108/67  Pulse: 88 83 82 85  Resp: 13 19 17  (!) 21  Temp:      TempSrc:      SpO2: 96% 96% 96% 95%  Weight:      Height:       General: elderly female. Awake and alert and oriented x3. No acute cardiopulmonary distress.  HEENT: Normocephalic atraumatic.  Right and left ears normal in appearance.  Pupils equal, round, reactive to light. Extraocular muscles are intact. Sclerae anicteric and noninjected.  Moist mucosal membranes. No mucosal lesions.  Neck: Neck supple without lymphadenopathy. No carotid bruits. No masses palpated.  Cardiovascular: Regular rate with normal S1-S2 sounds. No murmurs, rubs, gallops auscultated. increased JVD. 2+ pitting edema to knees bilaterally Respiratory: Good respiratory effort with rales in bases bilaterally.  No accessory muscle use. Abdomen: Soft, nontender, nondistended. Active bowel  sounds. No masses or hepatosplenomegaly  Skin: No rashes, lesions, or ulcerations.  Dry, warm to touch. 2+ dorsalis pedis and radial pulses. Musculoskeletal: No calf or leg pain. All major joints not erythematous nontender.  No upper or lower joint deformation.  Good ROM.  No contractures  Psychiatric: Intact judgment and insight. Pleasant and cooperative. Neurologic: No focal neurological deficits. Strength is 5/5 and symmetric in upper and lower extremities.  Cranial nerves II through XII are grossly intact.  Data Reviewed: Results for orders placed or performed during the hospital encounter of 11/27/22 (from the past 24 hour(s))  Resp panel by RT-PCR (RSV, Flu A&B, Covid) Anterior Nasal Swab     Status: None   Collection Time: 11/27/22  2:00 PM   Specimen: Anterior Nasal Swab  Result Value Ref Range   SARS Coronavirus 2 by RT PCR NEGATIVE NEGATIVE   Influenza A by PCR NEGATIVE NEGATIVE   Influenza B by PCR NEGATIVE NEGATIVE   Resp Syncytial Virus by PCR NEGATIVE NEGATIVE  Comprehensive metabolic panel     Status: Abnormal   Collection Time: 11/27/22  2:00 PM  Result Value Ref Range   Sodium 121 (L) 135 - 145 mmol/L   Potassium 4.8 3.5 - 5.1 mmol/L   Chloride 93 (L) 98 - 111 mmol/L   CO2 15 (L) 22 - 32 mmol/L   Glucose, Bld 160 (H) 70 - 99 mg/dL   BUN 30 (H) 8 - 23 mg/dL   Creatinine, Ser 2.95 (H) 0.44 - 1.00 mg/dL   Calcium 8.6 (L) 8.9 - 10.3 mg/dL   Total Protein 7.1 6.5 - 8.1 g/dL   Albumin 3.5 3.5 - 5.0 g/dL   AST 20 15 - 41 U/L   ALT 17 0 - 44 U/L   Alkaline Phosphatase 42 38 - 126 U/L   Total Bilirubin 0.7 <1.2 mg/dL   GFR, Estimated 42 (L) >60 mL/min   Anion gap 13 5 - 15  CBC     Status: Abnormal   Collection Time: 11/27/22  2:00 PM  Result Value Ref Range   WBC 8.5 4.0 - 10.5 K/uL   RBC 3.21 (L) 3.87 - 5.11 MIL/uL   Hemoglobin 9.8 (L) 12.0 - 15.0 g/dL   HCT 62.1 (L) 30.8 - 65.7 %   MCV 90.7 80.0 - 100.0 fL   MCH 30.5 26.0 - 34.0 pg   MCHC 33.7 30.0 - 36.0 g/dL    RDW 84.6 96.2 - 95.2 %   Platelets 244 150 - 400 K/uL   nRBC 0.0 0.0 - 0.2 %  Brain natriuretic peptide     Status: Abnormal   Collection Time: 11/27/22  2:01 PM  Result Value Ref Range   B Natriuretic Peptide 1,369.0 (H) 0.0 - 100.0 pg/mL  Troponin I (High Sensitivity)     Status: Abnormal   Collection Time: 11/27/22  2:06 PM  Result Value Ref Range   Troponin I (High Sensitivity) 1,849 (HH) <18 ng/L    No results found.   Assessment and  Plan: No notes have been filed under this hospital service. Service: Hospitalist  Active Problems:   Diabetes mellitus without complication (HCC)   Hypertension   Hypercholesteremia   Acute clinical systolic heart failure (HCC)   NSTEMI (non-ST elevated myocardial infarction) (HCC)   Hyponatremia   Metabolic acidosis  NSTEMI Heparin started Heart failure Telemetry monitoring Strict I/O Daily Weights Diuresis: lasix Potassium: 40 mEq twice a day by mouth Echo cardiac exam tomorrow Repeat BMP tomorrow  Acute hypervolemic hyponatremia This is likely secondary to fluid overload state due to heart failure.  Will fluid restrict and diurese BMP every 4 hours Metabolic acidosis Will watch for resolution with improvement of fluid status Diabetes Hypertension    Advance Care Planning:   Code Status: Not on file DNR per patient. This is consistent with her advanced directive  Consults: cardiology  Family Communication: daughters present  Severity of Illness: The appropriate patient status for this patient is INPATIENT. Inpatient status is judged to be reasonable and necessary in order to provide the required intensity of service to ensure the patient's safety. The patient's presenting symptoms, physical exam findings, and initial radiographic and laboratory data in the context of their chronic comorbidities is felt to place them at high risk for further clinical deterioration. Furthermore, it is not anticipated that the patient will  be medically stable for discharge from the hospital within 2 midnights of admission.   * I certify that at the point of admission it is my clinical judgment that the patient will require inpatient hospital care spanning beyond 2 midnights from the point of admission due to high intensity of service, high risk for further deterioration and high frequency of surveillance required.*  Author: Levie Heritage, DO 11/27/2022 4:11 PM  For on call review [email or web address].

## 2022-11-27 NOTE — Progress Notes (Signed)
PHARMACY - ANTICOAGULATION CONSULT NOTE  Pharmacy Consult for heparin Indication: chest pain/ACS  Allergies  Allergen Reactions   Hydrocodone    Oxycodone-Acetaminophen    Penicillins     Patient Measurements: Height: 5\' 3"  (160 cm) Weight: 76.2 kg (168 lb 1.6 oz) IBW/kg (Calculated) : 52.4 Heparin Dosing Weight: 68.7kg  Vital Signs: Temp: 98 F (36.7 C) (11/15 1341) Temp Source: Oral (11/15 1341) BP: 108/67 (11/15 1500) Pulse Rate: 85 (11/15 1500)  Labs: Recent Labs    11/27/22 1400 11/27/22 1406  HGB 9.8*  --   HCT 29.1*  --   PLT 244  --   CREATININE 1.27*  --   TROPONINIHS  --  1,849*    Estimated Creatinine Clearance: 32.9 mL/min (A) (by C-G formula based on SCr of 1.27 mg/dL (H)).   Medical History: Past Medical History:  Diagnosis Date   Arthritis    Diabetes mellitus without complication (HCC)    Hypercholesteremia    Hypertension    Assessment: 82 year old female admitted to [facility] with leg swelling and shortness of breath. Cardiac enzymes elevated. New orders to start IV heparin for rule out ACS. Patient does not appear to be on anticoagulation prior to admit.   Goal of Therapy:  Heparin level 0.3-0.7 units/ml Monitor platelets by anticoagulation protocol: Yes   Plan:  Give 4000 units bolus x 1 Start heparin infusion at 900 units/hr Check anti-Xa level in 8 hours and daily while on heparin Continue to monitor H&H and platelets  Sheppard Coil PharmD., BCPS Clinical Pharmacist 11/27/2022 3:55 PM

## 2022-11-28 ENCOUNTER — Inpatient Hospital Stay (HOSPITAL_COMMUNITY): Payer: Medicare Other

## 2022-11-28 DIAGNOSIS — E871 Hypo-osmolality and hyponatremia: Secondary | ICD-10-CM

## 2022-11-28 DIAGNOSIS — E7800 Pure hypercholesterolemia, unspecified: Secondary | ICD-10-CM | POA: Diagnosis not present

## 2022-11-28 DIAGNOSIS — E119 Type 2 diabetes mellitus without complications: Secondary | ICD-10-CM

## 2022-11-28 DIAGNOSIS — I5021 Acute systolic (congestive) heart failure: Secondary | ICD-10-CM | POA: Diagnosis not present

## 2022-11-28 DIAGNOSIS — I214 Non-ST elevation (NSTEMI) myocardial infarction: Secondary | ICD-10-CM | POA: Diagnosis not present

## 2022-11-28 DIAGNOSIS — E872 Acidosis, unspecified: Secondary | ICD-10-CM

## 2022-11-28 DIAGNOSIS — I10 Essential (primary) hypertension: Secondary | ICD-10-CM

## 2022-11-28 LAB — BASIC METABOLIC PANEL
Anion gap: 10 (ref 5–15)
Anion gap: 9 (ref 5–15)
Anion gap: 8 (ref 5–15)
Anion gap: 10 (ref 5–15)
Anion gap: 10 (ref 5–15)
BUN: 27 mg/dL — ABNORMAL HIGH (ref 8–23)
BUN: 30 mg/dL — ABNORMAL HIGH (ref 8–23)
BUN: 29 mg/dL — ABNORMAL HIGH (ref 8–23)
BUN: 31 mg/dL — ABNORMAL HIGH (ref 8–23)
BUN: 30 mg/dL — ABNORMAL HIGH (ref 8–23)
CO2: 16 mmol/L — ABNORMAL LOW (ref 22–32)
CO2: 17 mmol/L — ABNORMAL LOW (ref 22–32)
CO2: 18 mmol/L — ABNORMAL LOW (ref 22–32)
CO2: 16 mmol/L — ABNORMAL LOW (ref 22–32)
CO2: 18 mmol/L — ABNORMAL LOW (ref 22–32)
Calcium: 8.6 mg/dL — ABNORMAL LOW (ref 8.9–10.3)
Calcium: 8.4 mg/dL — ABNORMAL LOW (ref 8.9–10.3)
Calcium: 8.5 mg/dL — ABNORMAL LOW (ref 8.9–10.3)
Calcium: 8.3 mg/dL — ABNORMAL LOW (ref 8.9–10.3)
Calcium: 8.8 mg/dL — ABNORMAL LOW (ref 8.9–10.3)
Chloride: 96 mmol/L — ABNORMAL LOW (ref 98–111)
Chloride: 93 mmol/L — ABNORMAL LOW (ref 98–111)
Chloride: 97 mmol/L — ABNORMAL LOW (ref 98–111)
Chloride: 96 mmol/L — ABNORMAL LOW (ref 98–111)
Chloride: 97 mmol/L — ABNORMAL LOW (ref 98–111)
Creatinine, Ser: 1.2 mg/dL — ABNORMAL HIGH (ref 0.44–1.00)
Creatinine, Ser: 1.19 mg/dL — ABNORMAL HIGH (ref 0.44–1.00)
Creatinine, Ser: 1.34 mg/dL — ABNORMAL HIGH (ref 0.44–1.00)
Creatinine, Ser: 1.38 mg/dL — ABNORMAL HIGH (ref 0.44–1.00)
Creatinine, Ser: 1.34 mg/dL — ABNORMAL HIGH (ref 0.44–1.00)
GFR, Estimated: 45 mL/min — ABNORMAL LOW (ref >60)
GFR, Estimated: 45 mL/min — ABNORMAL LOW (ref >60)
GFR, Estimated: 39 mL/min — ABNORMAL LOW (ref >60)
GFR, Estimated: 38 mL/min — ABNORMAL LOW (ref >60)
GFR, Estimated: 39 mL/min — ABNORMAL LOW (ref >60)
Glucose, Bld: 176 mg/dL — ABNORMAL HIGH (ref 70–99)
Glucose, Bld: 230 mg/dL — ABNORMAL HIGH (ref 70–99)
Glucose, Bld: 207 mg/dL — ABNORMAL HIGH (ref 70–99)
Glucose, Bld: 144 mg/dL — ABNORMAL HIGH (ref 70–99)
Glucose, Bld: 91 mg/dL (ref 70–99)
Potassium: 5.1 mmol/L (ref 3.5–5.1)
Potassium: 6 mmol/L — ABNORMAL HIGH (ref 3.5–5.1)
Potassium: 5.7 mmol/L — ABNORMAL HIGH (ref 3.5–5.1)
Potassium: 5.8 mmol/L — ABNORMAL HIGH (ref 3.5–5.1)
Potassium: 5.3 mmol/L — ABNORMAL HIGH (ref 3.5–5.1)
Sodium: 122 mmol/L — ABNORMAL LOW (ref 135–145)
Sodium: 119 mmol/L — CL (ref 135–145)
Sodium: 123 mmol/L — ABNORMAL LOW (ref 135–145)
Sodium: 122 mmol/L — ABNORMAL LOW (ref 135–145)
Sodium: 125 mmol/L — ABNORMAL LOW (ref 135–145)

## 2022-11-28 LAB — RESPIRATORY PANEL BY PCR

## 2022-11-28 LAB — GLUCOSE, CAPILLARY
Glucose-Capillary: 265 mg/dL — ABNORMAL HIGH (ref 70–99)
Glucose-Capillary: 212 mg/dL — ABNORMAL HIGH (ref 70–99)
Glucose-Capillary: 123 mg/dL — ABNORMAL HIGH (ref 70–99)
Glucose-Capillary: 102 mg/dL — ABNORMAL HIGH (ref 70–99)
Glucose-Capillary: 132 mg/dL — ABNORMAL HIGH (ref 70–99)

## 2022-11-28 LAB — HEPARIN LEVEL (UNFRACTIONATED)
Heparin Unfractionated: 0.23 [IU]/mL — ABNORMAL LOW (ref 0.30–0.70)
Heparin Unfractionated: 0.19 [IU]/mL — ABNORMAL LOW (ref 0.30–0.70)
Heparin Unfractionated: 0.35 [IU]/mL (ref 0.30–0.70)
Heparin Unfractionated: 0.36 [IU]/mL (ref 0.30–0.70)

## 2022-11-28 LAB — ECHOCARDIOGRAM COMPLETE
AR max vel: 1.81 cm2
AV Area VTI: 1.83 cm2
AV Area mean vel: 1.67 cm2
AV Mean grad: 2.1 mm[Hg]
AV Peak grad: 4.1 mm[Hg]
Ao pk vel: 1.01 m/s
Area-P 1/2: 5.97 cm2
Height: 62 in
MV M vel: 3.95 m/s
MV Peak grad: 62.4 mm[Hg]
Radius: 0.6 cm
S' Lateral: 4.5 cm
Single Plane A4C EF: 18.9 %
Weight: 2624.36 [oz_av]

## 2022-11-28 LAB — TROPONIN I (HIGH SENSITIVITY)
Troponin I (High Sensitivity): 1889 ng/L (ref <18)
Troponin I (High Sensitivity): 1425 ng/L (ref <18)
Troponin I (High Sensitivity): 1433 ng/L (ref <18)

## 2022-11-28 LAB — CBC
HCT: 27.8 % — ABNORMAL LOW (ref 36.0–46.0)
Hemoglobin: 9.2 g/dL — ABNORMAL LOW (ref 12.0–15.0)
MCH: 29.7 pg (ref 26.0–34.0)
MCHC: 33.1 g/dL (ref 30.0–36.0)
MCV: 89.7 fL (ref 80.0–100.0)
Platelets: 228 10*3/uL (ref 150–400)
RBC: 3.1 MIL/uL — ABNORMAL LOW (ref 3.87–5.11)
RDW: 14.1 % (ref 11.5–15.5)
WBC: 10.2 10*3/uL (ref 4.0–10.5)
nRBC: 0 % (ref 0.0–0.2)

## 2022-11-28 LAB — TSH: TSH: 1.26 u[IU]/mL (ref 0.350–4.500)

## 2022-11-28 LAB — OSMOLALITY: Osmolality: 273 mosm/kg — ABNORMAL LOW (ref 275–295)

## 2022-11-28 LAB — OSMOLALITY, URINE: Osmolality, Ur: 355 mosm/kg (ref 300–900)

## 2022-11-28 LAB — SODIUM, URINE, RANDOM: Sodium, Ur: 54 mmol/L

## 2022-11-28 MED ORDER — MIDODRINE HCL 5 MG PO TABS
10.0000 mg | ORAL_TABLET | ORAL | Status: AC
  Administered 2022-11-28: 10 mg via ORAL
  Filled 2022-11-28: qty 2

## 2022-11-28 MED ORDER — GUAIFENESIN-DM 100-10 MG/5ML PO SYRP
5.0000 mL | ORAL_SOLUTION | ORAL | Status: DC | PRN
  Administered 2022-11-28 – 2022-12-02 (×2): 5 mL via ORAL
  Filled 2022-11-28 (×2): qty 5

## 2022-11-28 MED ORDER — SODIUM BICARBONATE 650 MG PO TABS
1300.0000 mg | ORAL_TABLET | Freq: Two times a day (BID) | ORAL | Status: DC
Start: 2022-11-28 — End: 2022-11-30
  Administered 2022-11-28 – 2022-11-30 (×4): 1300 mg via ORAL
  Filled 2022-11-28 (×4): qty 2

## 2022-11-28 MED ORDER — PROCHLORPERAZINE EDISYLATE 10 MG/2ML IJ SOLN
5.0000 mg | Freq: Four times a day (QID) | INTRAMUSCULAR | Status: DC | PRN
  Administered 2022-11-28: 5 mg via INTRAVENOUS
  Filled 2022-11-28: qty 2

## 2022-11-28 MED ORDER — FUROSEMIDE 10 MG/ML IJ SOLN: 20.0000 mg | INTRAMUSCULAR | Status: DC

## 2022-11-28 MED ORDER — PANTOPRAZOLE SODIUM 40 MG IV SOLR
40.0000 mg | Freq: Two times a day (BID) | INTRAVENOUS | Status: DC
  Administered 2022-11-28 – 2022-12-01 (×7): 40 mg via INTRAVENOUS
  Filled 2022-11-28 (×7): qty 10

## 2022-11-28 MED ORDER — FUROSEMIDE 10 MG/ML IJ SOLN
20.0000 mg | Freq: Once | INTRAMUSCULAR | Status: AC
  Administered 2022-11-28: 20 mg via INTRAVENOUS
  Filled 2022-11-28: qty 2

## 2022-11-28 MED ORDER — SODIUM ZIRCONIUM CYCLOSILICATE 10 G PO PACK
10.0000 g | PACK | Freq: Two times a day (BID) | ORAL | Status: AC
  Administered 2022-11-28 (×2): 10 g via ORAL
  Filled 2022-11-28 (×2): qty 1

## 2022-11-28 NOTE — Progress Notes (Signed)
PHARMACY - ANTICOAGULATION CONSULT NOTE  Pharmacy Consult for heparin Indication: chest pain/ACS  Allergies  Allergen Reactions   Hydrocodone    Oxycodone-Acetaminophen    Penicillins     Patient Measurements: Height: 5\' 2"  (157.5 cm) Weight: 74.4 kg (164 lb 0.4 oz) IBW/kg (Calculated) : 50.1 Heparin Dosing Weight: 68.7kg  Vital Signs: Temp: 97.4 F (36.3 C) (11/16 1900) Temp Source: Oral (11/16 1900) BP: 102/57 (11/16 2000) Pulse Rate: 77 (11/16 2000)  Labs: Recent Labs    11/27/22 1400 11/27/22 1406 11/28/22 0136 11/28/22 0314 11/28/22 0315 11/28/22 0719 11/28/22 0831 11/28/22 1211 11/28/22 1759 11/28/22 2049  HGB 9.8*  --   --   --  9.2*  --   --   --   --   --   HCT 29.1*  --   --   --  27.8*  --   --   --   --   --   PLT 244  --   --   --  228  --   --   --   --   --   HEPARINUNFRC  --   --   --  0.19*  --   --   --  0.35  --  0.36  CREATININE 1.27*  --   --   --   --  1.19* 1.34* 1.38* 1.34*  --   TROPONINIHS  --    < > 1,889*  --   --  1,425* 1,433*  --   --   --    < > = values in this interval not displayed.    Estimated Creatinine Clearance: 30 mL/min (A) (by C-G formula based on SCr of 1.34 mg/dL (H)).   Medical History: Past Medical History:  Diagnosis Date   Arthritis    Hearing loss    Hypercholesteremia    Hypertension    Macular degeneration    Type 2 diabetes mellitus (HCC)    Assessment: 82 year old female admitted to [facility] with leg swelling and shortness of breath. Cardiac enzymes elevated. New orders to start IV heparin for rule out ACS. Patient does not appear to be on anticoagulation prior to admit.   Heparin level remains therapeutic at 0.36.  Goal of Therapy:  Heparin level 0.3-0.7 units/ml Monitor platelets by anticoagulation protocol: Yes   Plan:  Continue heparin 1000 units/hr  Daily heparin level and CBC  Monitor for s/sx of bleeding    Fredonia Highland, PharmD, BCPS, [facility] Clinical Pharmacist [phone] Please check  AMION for all [facility] Pharmacy numbers 11/28/2022

## 2022-11-28 NOTE — Significant Event (Signed)
Asked to come see the patient due to worsening chest discomfort, nausea, and hypotension.  Primary team concerned about cardiogenic shock.  On my examination patient had appears uncomfortable but hemodynamically stable.  Blood pressure on the lower side 90s to 100s systolic.  Heart rates in the 100s.  Having ongoing symptoms and repeat troponin elevated from earlier.  She also has some fluid overload.  Will give her 20 mg IV Lasix at this time and repeat troponin.  She may need to have cardiac catheterization over the weekend.

## 2022-11-28 NOTE — Progress Notes (Signed)
PHARMACY - ANTICOAGULATION CONSULT NOTE  Pharmacy Consult for heparin Indication: chest pain/ACS  Allergies  Allergen Reactions   Hydrocodone    Oxycodone-Acetaminophen    Penicillins     Patient Measurements: Height: 5\' 2"  (157.5 cm) Weight: 76 kg (167 lb 8 oz) IBW/kg (Calculated) : 50.1 Heparin Dosing Weight: 68.7kg  Vital Signs: Temp: 97.9 F (36.6 C) (11/15 2302) Temp Source: Oral (11/15 2302) BP: 102/58 (11/15 2302) Pulse Rate: 78 (11/15 2302)  Labs: Recent Labs    11/27/22 1400 11/27/22 1406 11/27/22 1617 11/27/22 2043 11/28/22 0136  HGB 9.8*  --   --   --   --   HCT 29.1*  --   --   --   --   PLT 244  --   --   --   --   CREATININE 1.27*  --   --   --   --   TROPONINIHS  --    < > 1,450* 1,684* 1,889*   < > = values in this interval not displayed.    Estimated Creatinine Clearance: 32.1 mL/min (A) (by C-G formula based on SCr of 1.27 mg/dL (H)).   Medical History: Past Medical History:  Diagnosis Date   Arthritis    Hearing loss    Hypercholesteremia    Hypertension    Macular degeneration    Type 2 diabetes mellitus (HCC)    Assessment: 82 year old female admitted to [facility] with leg swelling and shortness of breath. Cardiac enzymes elevated. New orders to start IV heparin for rule out ACS. Patient does not appear to be on anticoagulation prior to admit.   11/16 AM update:  Heparin level sub-therapeutic at 0.23 Not showing in EPIC due to [facility] transfer (showing in pharmacy power BI report)  Goal of Therapy:  Heparin level 0.3-0.7 units/ml Monitor platelets by anticoagulation protocol: Yes   Plan:  Inc heparin to 1000 units/hr Heparin level in 8 hours  Abran Duke, PharmD, BCPS Clinical Pharmacist Phone: [phone]

## 2022-11-28 NOTE — Plan of Care (Signed)
  Problem: Education: Goal: Knowledge of General Education information will improve Description: Including pain rating scale, medication(s)/side effects and non-pharmacologic comfort measures Outcome: Progressing   Problem: Health Behavior/Discharge Planning: Goal: Ability to manage health-related needs will improve Outcome: Not Progressing   Problem: Clinical Measurements: Goal: Ability to maintain clinical measurements within normal limits will improve Outcome: Not Progressing Goal: Will remain free from infection Outcome: Progressing Goal: Diagnostic test results will improve Outcome: Progressing Goal: Respiratory complications will improve Outcome: Not Progressing Goal: Cardiovascular complication will be avoided Outcome: Not Progressing

## 2022-11-28 NOTE — Progress Notes (Signed)
[facility]                                                                               Debra Grimes, is a 82 y.o. female, DOB - 04-13-1940, ZOX:[id number] Admit date - 11/27/2022    Outpatient Primary MD for the patient is [facility]  LOS - 1  days    Brief summary    Debra Grimes is a 82 y.o. female with medical history significant of diabetes, hypertension, hyperlipidemia presents with several days of shortness of breath and generally feeling fairly poor, with bilateral lower extremity edema.  She was admitted for NSTEMI and acute CHF.   Assessment & Plan    Assessment and Plan:   Acute respiratory failure with hypoxia secondary to NSTEMI AND Acute systolic CHF: On IV heparin and IV lasix 40 mg BID . Continue with ZOCOR.  Daily weights and strict intake and output. Not much urine output after the dose of lasix earlier this am.  Echocardiogram done and reviewed with family.  Cardiology on board and plan for cath when scheduled.  RVP panel is negative. CXR showing Cardiomegaly with central vascular prominence and mild-to-moderate interstitial edema with a basal gradient, with a mild interval worsening in the edema.   Acute hypervolemic hyponatremia:  Slowly improving.  BMP every 12 hours.  122 today.  Check bmp tonight.  Check TSH, serum osmo, urine sodium, urine osmo, am cortisol.    AKI;  Suspect cardio renal syndrome rather than AKI from diuresis.  Metabolic acidosis non anion gap.  Will start the patient on sodium bicarb tablets.     Hyperkalemia:  Started the patient on lokelma.  RECHECK K tonight.    Type 2 DM;  With hyperglycemia CBG (last 3)  Recent Labs    11/28/22 0806 11/28/22 1233 11/28/22 1625  GLUCAP 212* 123* 102*   Resume SSI for now.  Gabapentin for diabetic neuropathy.  Hemoglobin A1c is 6.8%  GERD On IV Ppi BID.    Anemia of chronic disease Hemoglobin around 9.  Checking stool for occult  blood.    Hypertension:  BP parameters are borderline low.    Hyperlipidemia:  Resume statin.   In view of her age, and multiple medical problems, and new ischemic cardiomyopathy, she has poor prognosis. Recommend palliative care consult.    Estimated body mass index is 30 kg/m as calculated from the following:   Height as of this encounter: 5\' 2"  (1.575 m).   Weight as of this encounter: 74.4 kg.  Code Status: DNR DVT Prophylaxis:     Level of Care: Level of care: Progressive Family Communication:family at bedside.   Disposition Plan:     Remains inpatient appropriate:  not medically stable.   Procedures:  Echocardiogram.  Consultants:   cardiology  Antimicrobials:   Anti-infectives (From admission, onward)    None        Medications  Scheduled Meds:  furosemide  40 mg Intravenous BID   gabapentin  300 mg Oral TID   insulin aspart  0-15 Units Subcutaneous TID WC   insulin aspart  0-5 Units Subcutaneous QHS   pantoprazole (PROTONIX) IV  40  mg Intravenous BID   simvastatin  40 mg Oral QPM   sodium zirconium cyclosilicate  10 g Oral BID   Continuous Infusions:  heparin 1,000 Units/hr (11/28/22 1052)   PRN Meds:.acetaminophen **OR** acetaminophen, guaiFENesin-dextromethorphan, ondansetron **OR** ondansetron (ZOFRAN) IV, polyethylene glycol, prochlorperazine    Subjective:   Mia Leimer was seen and examined today.  Still has sob and requiring upto 4 lit of [place] oxygen.   Objective:   Vitals:   11/28/22 0925 11/28/22 0955 11/28/22 1000 11/28/22 1050  BP: 98/61 (!) 94/58  (!) 92/56  Pulse: 82 83 81 72  Resp:      Temp:      TempSrc:      SpO2: 93% 94% 94% 96%  Weight:      Height:        Intake/Output Summary (Last 24 hours) at 11/28/2022 1226 Last data filed at 11/28/2022 0821 Gross per 24 hour  Intake 185.48 ml  Output --  Net 185.48 ml   Filed Weights   11/27/22 1337 11/27/22 2035 11/28/22 0356  Weight: 76.2 kg 76 kg 74.4 kg      Exam General exam: ill appearing elderly lady , in mild distress from SOB.  Respiratory system: DIMINISHED at bases. On  oxygen.  Cardiovascular system: S1 & S2 heard, RRR.  Gastrointestinal system: Abdomen is soft, NT Central nervous system: Alert and oriented to person and place.  Extremities: leg edema.  Skin: No rashes,  Psychiatry: unable to assess.    Data Reviewed:  I have personally reviewed following labs and imaging studies   CBC Lab Results  Component Value Date   WBC 10.2 11/28/2022   RBC 3.10 (L) 11/28/2022   HGB 9.2 (L) 11/28/2022   HCT 27.8 (L) 11/28/2022   MCV 89.7 11/28/2022   MCH 29.7 11/28/2022   PLT 228 11/28/2022   MCHC 33.1 11/28/2022   RDW 14.1 11/28/2022   LYMPHSABS 1.7 08/21/2014   MONOABS 0.5 08/21/2014   EOSABS 0.2 08/21/2014   BASOSABS 0.0 08/21/2014     Last metabolic panel Lab Results  Component Value Date   NA 123 (L) 11/28/2022   K 5.7 (H) 11/28/2022   CL 97 (L) 11/28/2022   CO2 18 (L) 11/28/2022   BUN 29 (H) 11/28/2022   CREATININE 1.34 (H) 11/28/2022   GLUCOSE 207 (H) 11/28/2022   GFRNONAA 39 (L) 11/28/2022   GFRAA >60 12/25/2014   CALCIUM 8.5 (L) 11/28/2022   PROT 7.1 11/27/2022   ALBUMIN 3.5 11/27/2022   BILITOT 0.7 11/27/2022   ALKPHOS 42 11/27/2022   AST 20 11/27/2022   ALT 17 11/27/2022   ANIONGAP 8 11/28/2022    CBG (last 3)  Recent Labs    11/27/22 2116 11/28/22 0619 11/28/22 0806  GLUCAP 116* 265* 212*      Coagulation Profile: No results for input(s): "INR", "PROTIME" in the last 168 hours.   Radiology Studies: ECHOCARDIOGRAM COMPLETE  Result Date: 11/28/2022    ECHOCARDIOGRAM REPORT   Patient Name:   Debra Grimes Date of Exam: 11/28/2022 Medical Rec #:  [id number]        Height:       62.0 in Accession #:    [id number]       Weight:       164.0 lb Date of Birth:  Feb 12, 1940        BSA:          1.757 m Patient Age:    35 years  BP:           103/65 mmHg Patient Gender: F                 HR:           87 bpm. Exam Location:  Inpatient Procedure: 2D Echo, Cardiac Doppler and Color Doppler Indications:    NSTEMI. Acute systolic chf.  History:        Patient has no prior history of Echocardiogram examinations.                 Risk Factors:Diabetes, Hypertension and Dyslipidemia.  Sonographer:    Delcie Roch RDCS Referring Phys: [phone] JACOB J STINSON IMPRESSIONS  1. Left ventricular ejection fraction, by estimation, is 25 to 30%. The left ventricle has severely decreased function. The left ventricle demonstrates regional wall motion abnormalities (see scoring diagram/findings for description). There is mild left  ventricular hypertrophy. Left ventricular diastolic parameters are indeterminate.  2. Right ventricular systolic function is mildly reduced. The right ventricular size is normal. There is moderately elevated pulmonary artery systolic pressure. The estimated right ventricular systolic pressure is 47.3 mmHg.  3. Left atrial size was moderately dilated.  4. The mitral valve is degenerative. Moderate to severe mitral valve regurgitation. No evidence of mitral stenosis. Moderate mitral annular calcification.  5. The aortic valve is abnormal. There is moderate calcification of the aortic valve. There is moderate thickening of the aortic valve. Aortic valve regurgitation is not visualized. Aortic valve sclerosis/calcification is present, without any evidence of aortic stenosis.  6. The inferior vena cava is dilated in size with <50% respiratory variability, suggesting right atrial pressure of 15 mmHg. FINDINGS  Left Ventricle: Left ventricular ejection fraction, by estimation, is 25 to 30%. The left ventricle has severely decreased function. The left ventricle demonstrates regional wall motion abnormalities. The left ventricular internal cavity size was normal  in size. There is mild left ventricular hypertrophy. Left ventricular diastolic function could not be evaluated due to mitral  regurgitation (moderate or greater). Left ventricular diastolic parameters are indeterminate.  LV Wall Scoring: The mid and distal anterior septum, apical lateral segment, mid inferoseptal segment, apical anterior segment, and apical inferior segment are akinetic. The posterior wall, mid anterolateral segment, mid inferior segment, and basal inferoseptal segment are hypokinetic. Right Ventricle: The right ventricular size is normal. No increase in right ventricular wall thickness. Right ventricular systolic function is mildly reduced. There is moderately elevated pulmonary artery systolic pressure. The tricuspid regurgitant velocity is 2.84 m/s, and with an assumed right atrial pressure of 15 mmHg, the estimated right ventricular systolic pressure is 47.3 mmHg. Left Atrium: Left atrial size was moderately dilated. Right Atrium: Right atrial size was normal in size. Pericardium: There is no evidence of pericardial effusion. Mitral Valve: The mitral valve is degenerative in appearance. Moderate mitral annular calcification. Moderate to severe mitral valve regurgitation. No evidence of mitral valve stenosis. The mean mitral valve gradient is 2.4 mmHg. Tricuspid Valve: The tricuspid valve is normal in structure. Tricuspid valve regurgitation is mild . No evidence of tricuspid stenosis. Aortic Valve: The aortic valve is abnormal. There is moderate calcification of the aortic valve. There is moderate thickening of the aortic valve. Aortic valve regurgitation is not visualized. Aortic valve sclerosis/calcification is present, without any evidence of aortic stenosis. Aortic valve mean gradient measures 2.1 mmHg. Aortic valve peak gradient measures 4.1 mmHg. Aortic valve area, by VTI measures 1.83 cm. Pulmonic Valve: The pulmonic valve was normal in structure. Pulmonic  valve regurgitation is trivial. No evidence of pulmonic stenosis. Aorta: The aortic root is normal in size and structure. Venous: The inferior vena cava is  dilated in size with less than 50% respiratory variability, suggesting right atrial pressure of 15 mmHg. IAS/Shunts: The atrial septum is grossly normal.  LEFT VENTRICLE PLAX 2D LVIDd:         5.20 cm      Diastology LVIDs:         4.50 cm      LV e' medial:    5.11 cm/s LV PW:         0.90 cm      LV E/e' medial:  24.1 LV IVS:        1.00 cm      LV e' lateral:   5.87 cm/s LVOT diam:     1.70 cm      LV E/e' lateral: 21.0 LV SV:         30 LV SV Index:   17 LVOT Area:     2.27 cm  LV Volumes (MOD) LV vol d, MOD A4C: 106.0 ml LV vol s, MOD A4C: 86.0 ml LV SV MOD A4C:     106.0 ml RIGHT VENTRICLE          IVC RV Basal diam:  2.90 cm  IVC diam: 2.40 cm TAPSE (M-mode): 1.5 cm LEFT ATRIUM             Index        RIGHT ATRIUM           Index LA diam:        4.40 cm 2.50 cm/m   RA Area:     13.10 cm LA Vol (A2C):   90.6 ml 51.56 ml/m  RA Volume:   31.00 ml  17.64 ml/m LA Vol (A4C):   67.9 ml 38.64 ml/m LA Biplane Vol: 79.5 ml 45.24 ml/m  AORTIC VALVE AV Area (Vmax):    1.81 cm AV Area (Vmean):   1.67 cm AV Area (VTI):     1.83 cm AV Vmax:           101.24 cm/s AV Vmean:          69.029 cm/s AV VTI:            0.163 m AV Peak Grad:      4.1 mmHg AV Mean Grad:      2.1 mmHg LVOT Vmax:         80.60 cm/s LVOT Vmean:        50.800 cm/s LVOT VTI:          0.131 m LVOT/AV VTI ratio: 0.80  AORTA Ao Root diam: 2.90 cm Ao Asc diam:  3.10 cm MITRAL VALVE                  TRICUSPID VALVE MV Area (PHT): 5.97 cm       TR Peak grad:   32.3 mmHg MV Mean grad:  2.4 mmHg       TR Vmax:        284.00 cm/s MV Decel Time: 127 msec MR Peak grad:    62.4 mmHg    SHUNTS MR Mean grad:    39.0 mmHg    Systemic VTI:  0.13 m MR Vmax:         395.00 cm/s  Systemic Diam: 1.70 cm MR Vmean:        297.0 cm/s MR PISA:  2.26 cm MR PISA Eff ROA: 24 mm MR PISA Radius:  0.60 cm MV E velocity: 123.00 cm/s MV A velocity: 63.10 cm/s MV E/A ratio:  1.95 Weston Brass MD Electronically signed by Weston Brass MD Signature Date/Time:  11/28/2022/12:17:39 PM    Final    DG CHEST PORT 1 VIEW  Result Date: 11/28/2022 CLINICAL DATA:  200808 with hypoxia. EXAM: PORTABLE CHEST 1 VIEW COMPARISON:  Portable chest yesterday at 2:05 p.m. FINDINGS: 4:03 a.m. The heart is enlarged. There is central vascular prominence, mild-to-moderate interstitial edema with a basal gradient, and a mild interval worsening in the edema. Small pleural effusions appear similar. There is overlying hazy atelectasis or consolidation. No other focal lung opacity is seen. The mediastinum is stable. Aortic atherosclerosis. No new skeletal findings. IMPRESSION: 1. Cardiomegaly with central vascular prominence and mild-to-moderate interstitial edema with a basal gradient, with a mild interval worsening in the edema. 2. Small pleural effusions with overlying hazy atelectasis or consolidation appear similar. Electronically Signed   By: Almira Bar M.D.   On: 11/28/2022 04:41   DG Chest Port 1 View  Result Date: 11/27/2022 CLINICAL DATA:  Shortness of breath and bilateral lower extremity swelling. EXAM: PORTABLE CHEST 1 VIEW COMPARISON:  Chest radiograph dated 12/14/2021. FINDINGS: There is mild cardiomegaly with vascular congestion and edema. Small bilateral pleural effusions and bibasilar atelectasis. Pneumonia is not excluded. No pneumothorax. Atherosclerotic calcification of the aorta. No acute osseous pathology. IMPRESSION: Congestive heart failure with small bilateral pleural effusions and bibasilar atelectasis. Electronically Signed   By: Elgie Collard M.D.   On: 11/27/2022 17:11       Kathlen Mody M.D. [facility] 11/28/2022, 12:26 PM  Available via Epic secure chat 7am-7pm After 7 pm, please refer to night coverage provider listed on amion.

## 2022-11-28 NOTE — Progress Notes (Signed)
PHARMACY - ANTICOAGULATION CONSULT NOTE  Pharmacy Consult for heparin Indication: chest pain/ACS  Allergies  Allergen Reactions   Hydrocodone    Oxycodone-Acetaminophen    Penicillins     Patient Measurements: Height: 5\' 2"  (157.5 cm) Weight: 74.4 kg (164 lb 0.4 oz) IBW/kg (Calculated) : 50.1 Heparin Dosing Weight: 68.7kg  Vital Signs: Temp: 97.5 F (36.4 C) (11/16 1231) Temp Source: Oral (11/16 1231) BP: 98/59 (11/16 1231) Pulse Rate: 72 (11/16 1050)  Labs: Recent Labs    11/27/22 1400 11/27/22 1406 11/28/22 0136 11/28/22 0314 11/28/22 0315 11/28/22 0719 11/28/22 0831 11/28/22 1211  HGB 9.8*  --   --   --  9.2*  --   --   --   HCT 29.1*  --   --   --  27.8*  --   --   --   PLT 244  --   --   --  228  --   --   --   HEPARINUNFRC  --   --   --  0.19*  --   --   --  0.35  CREATININE 1.27*  --   --   --   --  1.19* 1.34* 1.38*  TROPONINIHS  --    < > 1,889*  --   --  1,425* 1,433*  --    < > = values in this interval not displayed.    Estimated Creatinine Clearance: 29.2 mL/min (A) (by C-G formula based on SCr of 1.38 mg/dL (H)).   Medical History: Past Medical History:  Diagnosis Date   Arthritis    Hearing loss    Hypercholesteremia    Hypertension    Macular degeneration    Type 2 diabetes mellitus (HCC)    Assessment: 82 year old female admitted to [facility] with leg swelling and shortness of breath. Cardiac enzymes elevated. New orders to start IV heparin for rule out ACS. Patient does not appear to be on anticoagulation prior to admit.   Heparin level therapeutic at 0.35 on 1000 units/hr. Hgb 9.2, plt wnl, trop 1443. No signs of bleeding noted.    Goal of Therapy:  Heparin level 0.3-0.7 units/ml Monitor platelets by anticoagulation protocol: Yes   Plan:  Continue heparin 1000 units/hr  Confirmatory 8 hour heparin level  Daily heparin level and CBC  Monitor for s/sx of bleeding   Thank you for involving pharmacy in the patient's care.   Theotis Burrow, PharmD PGY1 Acute Care Pharmacy Resident  11/28/2022 12:51 PM

## 2022-11-28 NOTE — Progress Notes (Signed)
Paged Dr. Margo Aye regarding pt bp 89/57 after two doses of midodrine. Provider will come see patient.

## 2022-11-28 NOTE — Progress Notes (Signed)
  Echocardiogram 2D Echocardiogram has been performed.  Delcie Roch 11/28/2022, 9:42 AM

## 2022-11-28 NOTE — Progress Notes (Addendum)
Received a call from bedside RN regarding the patient complaining of epigastric tightness, vomiting x 1 and sudden onset wheezing with uptrending troponin 1889 from 1684.  Cardiology on-call Dr. Hyacinth Meeker made aware.  Presented at bedside.  The patient is on heparin drip, mildly diaphoretic with increased work of breathing and audible wheezing.  1-2+ pitting edema in lower extremities bilaterally.  Epigastric tenderness with palpation, soft with normal bowel sounds.  Blood pressures are soft, 102/58 with MAP of 73.  Heart rate 94.  1 dose midodrine 10 mg x 1 ordered to be administered.  IV Lasix 20 mg x 1 given due to audible wheezing and increased work of breathing likely secondary to pulmonary edema and bilateral pleural effusions seen on chest x-ray.  Added IV Protonix 40 mg twice daily for epigastric pain, until GI cause can be ruled out.  States she occasionally takes Aleve for pain at home.  Stat CBC repeated, hemoglobin 9.2K from 9.8K yesterday.   Critical care time: 35 minutes.    Addendum: IV Lasix held due to hypotension and concern for developing cardiogenic shock.  Received a total of 3 doses of 10 mg midodrine.  Cardiology, Dr. Hyacinth Meeker, consulted to assist with the management.   Discussed the case with critical care medicine, Dr. Vassie Loll, recommended to continue current management and to consult and transfer to ICU if SBP drops below 90.  Awaiting 2D echo results and decision from cardiology for heart catheterization.   Will continue to closely monitor and treat as indicated.

## 2022-11-28 NOTE — Progress Notes (Signed)
   Patient Name: Debra Grimes Date of Encounter: 11/28/2022 [facility] Cardiologist: None McDowell  Interval Summary  .    Worsening chest pain overnight.   Vital Signs .    Vitals:   11/28/22 0725 11/28/22 0755 11/28/22 0805 11/28/22 0821  BP: 105/66 (!) 88/57 105/64   Pulse: 89 78 88 87  Resp:      Temp:      TempSrc:      SpO2: 94% 92% 93% 93%  Weight:      Height:       No intake or output data in the 24 hours ending 11/28/22 0826    11/28/2022    3:56 AM 11/27/2022    8:35 PM 11/27/2022    1:37 PM  Last 3 Weights  Weight (lbs) 164 lb 0.4 oz 167 lb 8 oz 168 lb 1.6 oz  Weight (kg) 74.4 kg 75.978 kg 76.25 kg      Telemetry/ECG    SR IVCD - Personally Reviewed  Physical Exam .   GEN: No acute distress.   Neck: No JVD Cardiac: RRR, no murmurs, rubs, or gallops.  Respiratory: Clear to auscultation bilaterally. GI: Soft, nontender, non-distended  MS: ankle 1+ edema  Assessment & Plan .     Principal Problem:   NSTEMI (non-ST elevated myocardial infarction) (HCC) Active Problems:   Diabetes mellitus without complication (HCC)   Hypertension   Hypercholesteremia   Acute clinical systolic heart failure (HCC)   Hyponatremia   Metabolic acidosis  NSTEMI - chest pain overnight however subsided with vomiting. - continue IV heparin - hypotensive, hold home BP meds -would give dose of lasix that has been ordered however primary team/nursing waiting on repeat labs. - EKG abnormal but stable compared to yesterday, SR IVCD   Hyponatremic and hyperkalemic - may respond to lasix - appears mildly volume overloaded with LE ankle edema  - repeat labs pending.   Discussed in detail with daughters and family at bedside, in agreement that we will obtain echocardiogram this am for risk stratification, hold home BP meds and continue IV heparin.   For questions or updates, please contact [facility] Please consult [email or web address] for contact info  under        Signed, Parke Poisson, MD

## 2022-11-29 DIAGNOSIS — Z7189 Other specified counseling: Secondary | ICD-10-CM | POA: Diagnosis not present

## 2022-11-29 DIAGNOSIS — E7800 Pure hypercholesterolemia, unspecified: Secondary | ICD-10-CM | POA: Diagnosis not present

## 2022-11-29 DIAGNOSIS — I5021 Acute systolic (congestive) heart failure: Secondary | ICD-10-CM | POA: Diagnosis not present

## 2022-11-29 DIAGNOSIS — Z515 Encounter for palliative care: Secondary | ICD-10-CM | POA: Diagnosis not present

## 2022-11-29 DIAGNOSIS — E119 Type 2 diabetes mellitus without complications: Secondary | ICD-10-CM | POA: Diagnosis not present

## 2022-11-29 DIAGNOSIS — I214 Non-ST elevation (NSTEMI) myocardial infarction: Secondary | ICD-10-CM | POA: Diagnosis not present

## 2022-11-29 LAB — CORTISOL: Cortisol, Plasma: 12.1 ug/dL

## 2022-11-29 LAB — BASIC METABOLIC PANEL
Anion gap: 9 (ref 5–15)
Anion gap: 10 (ref 5–15)
BUN: 28 mg/dL — ABNORMAL HIGH (ref 8–23)
BUN: 31 mg/dL — ABNORMAL HIGH (ref 8–23)
CO2: 21 mmol/L — ABNORMAL LOW (ref 22–32)
CO2: 20 mmol/L — ABNORMAL LOW (ref 22–32)
Calcium: 8.5 mg/dL — ABNORMAL LOW (ref 8.9–10.3)
Calcium: 8.5 mg/dL — ABNORMAL LOW (ref 8.9–10.3)
Chloride: 95 mmol/L — ABNORMAL LOW (ref 98–111)
Chloride: 93 mmol/L — ABNORMAL LOW (ref 98–111)
Creatinine, Ser: 1.35 mg/dL — ABNORMAL HIGH (ref 0.44–1.00)
Creatinine, Ser: 1.51 mg/dL — ABNORMAL HIGH (ref 0.44–1.00)
GFR, Estimated: 39 mL/min — ABNORMAL LOW (ref >60)
GFR, Estimated: 34 mL/min — ABNORMAL LOW (ref >60)
Glucose, Bld: 75 mg/dL (ref 70–99)
Glucose, Bld: 222 mg/dL — ABNORMAL HIGH (ref 70–99)
Potassium: 4.3 mmol/L (ref 3.5–5.1)
Potassium: 4.8 mmol/L (ref 3.5–5.1)
Sodium: 125 mmol/L — ABNORMAL LOW (ref 135–145)
Sodium: 123 mmol/L — ABNORMAL LOW (ref 135–145)

## 2022-11-29 LAB — HEPARIN LEVEL (UNFRACTIONATED): Heparin Unfractionated: 0.56 [IU]/mL (ref 0.30–0.70)

## 2022-11-29 LAB — GLUCOSE, CAPILLARY
Glucose-Capillary: 70 mg/dL (ref 70–99)
Glucose-Capillary: 180 mg/dL — ABNORMAL HIGH (ref 70–99)
Glucose-Capillary: 166 mg/dL — ABNORMAL HIGH (ref 70–99)
Glucose-Capillary: 162 mg/dL — ABNORMAL HIGH (ref 70–99)

## 2022-11-29 LAB — CBC
HCT: 26.9 % — ABNORMAL LOW (ref 36.0–46.0)
Hemoglobin: 8.9 g/dL — ABNORMAL LOW (ref 12.0–15.0)
MCH: 30 pg (ref 26.0–34.0)
MCHC: 33.1 g/dL (ref 30.0–36.0)
MCV: 90.6 fL (ref 80.0–100.0)
Platelets: 230 10*3/uL (ref 150–400)
RBC: 2.97 MIL/uL — ABNORMAL LOW (ref 3.87–5.11)
RDW: 14.3 % (ref 11.5–15.5)
WBC: 8.3 10*3/uL (ref 4.0–10.5)
nRBC: 0 % (ref 0.0–0.2)

## 2022-11-29 MED ORDER — FUROSEMIDE 10 MG/ML IJ SOLN: 40.0000 mg | Freq: Once | INTRAMUSCULAR | Status: DC

## 2022-11-29 MED ORDER — AMIODARONE LOAD VIA INFUSION
150.0000 mg | Freq: Once | INTRAVENOUS | Status: AC
  Administered 2022-11-29: 150 mg via INTRAVENOUS
  Filled 2022-11-29 (×2): qty 83.34

## 2022-11-29 MED ORDER — AMIODARONE HCL IN DEXTROSE 360-4.14 MG/200ML-% IV SOLN
30.0000 mg/h | INTRAVENOUS | Status: DC
  Administered 2022-11-29 – 2022-12-03 (×8): 30 mg/h via INTRAVENOUS
  Filled 2022-11-29 (×8): qty 200

## 2022-11-29 MED ORDER — MIDODRINE HCL 5 MG PO TABS
10.0000 mg | ORAL_TABLET | Freq: Two times a day (BID) | ORAL | Status: DC
  Administered 2022-11-29: 10 mg via ORAL
  Filled 2022-11-29 (×3): qty 2

## 2022-11-29 MED ORDER — AMIODARONE HCL IN DEXTROSE 360-4.14 MG/200ML-% IV SOLN
60.0000 mg/h | INTRAVENOUS | Status: DC
Start: 2022-11-29 — End: 2022-11-29
  Administered 2022-11-29: 60 mg/h via INTRAVENOUS
  Filled 2022-11-29: qty 200

## 2022-11-29 NOTE — Progress Notes (Signed)
   Patient Name: Debra Grimes Date of Encounter: 11/29/2022 [facility] Cardiologist: None [place]  Interval Summary  .    No recurrence of chest pain  Vital Signs .    Vitals:   11/28/22 2346 11/29/22 0000 11/29/22 0332 11/29/22 0801  BP: 92/61 101/71 100/64 (!) 102/59  Pulse: 71 69 75   Resp:   20 18  Temp:   97.9 F (36.6 C) 98 F (36.7 C)  TempSrc:   Oral Oral  SpO2: 99% 99% 99%   Weight:   75.5 kg   Height:        Intake/Output Summary (Last 24 hours) at 11/29/2022 0849 Last data filed at 11/29/2022 0410 Gross per 24 hour  Intake 197.82 ml  Output 350 ml  Net -152.18 ml      11/29/2022    3:32 AM 11/28/2022    3:56 AM 11/27/2022    8:35 PM  Last 3 Weights  Weight (lbs) 166 lb 7.2 oz 164 lb 0.4 oz 167 lb 8 oz  Weight (kg) 75.5 kg 74.4 kg 75.978 kg      Telemetry/ECG    SR IVCD - Personally Reviewed  Physical Exam .   GEN: No acute distress.   Neck: No JVD Cardiac: RRR, no murmurs, rubs, or gallops.  Respiratory: Clear to auscultation bilaterally. GI: Soft, nontender, non-distended  MS: ankle 1+ edema  Assessment & Plan .     Principal Problem:   NSTEMI (non-ST elevated myocardial infarction) [facility]) Active Problems:   Diabetes mellitus without complication (HCC)   Hypertension   Hypercholesteremia   Acute clinical systolic heart failure (HCC)   Hyponatremia   Metabolic acidosis  NSTEMI Acute systolic HF, ICM - chest pain overnight 11/15-16 however subsided with vomiting. No recurrence. - continue IV heparin - hypotensive, hold home BP meds, limits GDMT - concern for oliguria, will discuss further. Palliative has been consulted, agree.  - continue IV lasix 40 mg BID, will give additional 40 mg IV now to evaluate renal function.  - if adequate UOP, will consider cath tomorrow.  Called family and discussed with Debra Grimes daughter about prognosis with EF 25% and possible oligiuria. Will evaluate over the course of today and tomorrow  for improvement.   Hyponatremic and hyperkalemic - improved   For questions or updates, please contact [facility] Please consult [email or web address] for contact info under        Signed, Parke Poisson, MD

## 2022-11-29 NOTE — Consult Note (Signed)
Palliative Medicine Inpatient Consult Note  Consulting Provider:  Kathlen Mody, MD   Reason for consult:   Palliative Care Consult Services Palliative Medicine Consult  Reason for Consult? new ischemic cardiomyopathy., NSTEMI   11/29/2022  HPI:  Per intake H&P --> Debra Grimes is a 82 y.o. female with medical history significant of diabetes, hypertension, hyperlipidemia presents with several days of shortness of breath and generally feeling fairly poor, with bilateral lower extremity edema. She was admitted for NSTEMI and acute CHF. Palliative care consulted to support additional goals of care conversations.   Clinical Assessment/Goals of Care:  *Please note that this is a verbal dictation therefore any spelling or grammatical errors are due to the "Dragon Medical One" system interpretation.  I have reviewed medical records including EPIC notes, labs and imaging, received report from bedside RN, who shares patients movement is limited by hypotension.  Assessed the patient who is resting in bed though coherent.    I met with Debra Grimes to further discuss diagnosis prognosis, GOC, EOL wishes, disposition and options.   I introduced Palliative Medicine as specialized medical care for people living with serious illness. It focuses on providing relief from the symptoms and stress of a serious illness. The goal is to improve quality of life for both the patient and the family.  Medical History Review and Understanding:  Discussed patients past medical history significant for diabetes, hypertension, hyperlipidemia, osteoporosis,  newly identified ischemic cardiomyopathy, severe MV regurgitation.   Social History:  Debra Grimes shares that she lives in [place].  She has lived in various times throughout [place] throughout the duration of her life.  She is a widow.  She had 2 sons and 2 daughters though sadly both sons have passed away. 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 She shares that she enjoys spending time with her family. She is a woman of strong Christian faith.   Functional and Nutritional State:  Preceding admission, Debra Grimes had been living at home. She was in a single family home though could see her other loved ones homes from her house. She shares both daughters live quite close-by. She walks with a walker and shares she has been "slow" since undergoing an ankle repair.  Advance Directives:  A detailed discussion was had today regarding advanced directives.  Patient shares that she does have a living will and her daughter, Debra Grimes is her surrogate Management consultant.   Code Status:  Concepts specific to code status, artifical feeding and hydration, continued IV antibiotics and rehospitalization was had.  The difference between a aggressive medical intervention path  and a palliative comfort care path for this patient at this time was had.   Encouraged patient/family to consider DNR/DNI status understanding evidenced based poor outcomes in similar hospitalized patient, as the cause of arrest is likely associated with advanced chronic/terminal illness rather than an easily reversible acute cardio-pulmonary event. I explained that DNR/DNI does not change the medical plan and it only comes into effect after a person has arrested (died).  It is a protective measure to keep [place] from harming the patient in their last moments of life. Patient, Debra Grimes was agreeable to DNR/DNI with understanding that patient would not receive CPR, defibrillation, ACLS medications, or intubation.   Discussion:  We reviewed patients current health in the setting of her NSTEMI, heart failure, and MV regurgitation. We reviewed it's severity. Discussed that heart failure is a chronic  progressive disease. I shared the concern of the medical team in terms of patients short term and long term prognosis.   Debra Grimes and I  discussed the importance of having additional family present to further delineate goals. We discussed my calling Devery's daughters to see if we may be able to meet for additional conversation.   Josanna herself shares the hope for improvement and being ale to regain functional independence.   Discussed the importance of continued conversation with family and their  medical providers regarding overall plan of care and treatment options, ensuring decisions are within the context of the patients values and GOCs. __________________________________ Addendum:  I met in the afternoon with patients two daughters, Debra Grimes and Debra Grimes. We discussed the above.  Reviewed patients present heart failure and mitral valve regurgitation.   Family are aware of patients clinical state and the severity of her illness. 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 They understand the plan for a cardiac catheterization though they also understand this will be based upon patients kidney function.   Best case and worst case scenarios reviewed.Discussed hospice as a consideration should patient have no treatment options.   Goals at this time from a family perspective are to see if there is anything which can be intervened upon.  Patients daughter share clear understanding of patients wishes and how this relates to her quality of life.   Decision Maker: Debra Grimes (Daughter): [phone] (Mobile)   SUMMARY OF RECOMMENDATIONS   DNAR/DNI  Continue current care allowing time for outcomes  Discussed patients present health in regards to her ICM & mitral regurgitation  Best case and worst case reviewed  Patients goals are for improvement in function  Ongoing PMT support  Code Status/Advance Care Planning: DNAR/DNI  Palliative Prophylaxis:  Aspiration, Bowel Regimen, Delirium Protocol, Frequent Pain Assessment, Oral Care, Palliative Wound Care, and Turn Reposition  Additional Recommendations  (Limitations, Scope, Preferences): Continue current care  Psycho-social/Spiritual:  Desire for further Chaplaincy support: Yes - Christian Additional Recommendations: Education on iCMY   Prognosis: Limited overall.   Discharge Planning: Discharge plan is uncertain at this time.   Vitals:   11/29/22 0000 11/29/22 0332  BP: 101/71 100/64  Pulse: 69 75  Resp:  20  Temp:  97.9 F (36.6 C)  SpO2: 99% 99%    Intake/Output Summary (Last 24 hours) at 11/29/2022 0756 Last data filed at 11/29/2022 0410 Gross per 24 hour  Intake 383.3 ml  Output 350 ml  Net 33.3 ml   Last Weight  Most recent update: 11/29/2022  3:48 AM    Weight  75.5 kg (166 lb 7.2 oz)            Gen:  Elderly Caucasian F in NAD HEENT: moist mucous membranes CV: Irregular rate and rhythm  PULM:  On 4LPM [place], breathing is nonlabored ABD: soft/nontender/nondistended  EXT: (+)1 BLE  edema  Neuro: Alert and oriented x3 - hard of hearing  PPS: 40%   This conversation/these recommendations were discussed with patient primary care team, Dr. Blake Divine   Total Time: 18 Billing based on MDM: High  Problems Addressed: One acute or chronic illness or injury that poses a threat to life or bodily function  Amount and/or Complexity of Data: Category 3:Discussion of management or test interpretation with external physician/other qualified health care professional/appropriate source (not separately reported)  Risks: Decision regarding hospitalization or escalation of hospital care and Decision not to resuscitate or to de-escalate care because of  poor prognosis ______________________________________________________ Debra Grimes Palliative Medicine Team Team Cell Phone: [phone] Please utilize secure chat with additional questions, if there is no response within 30 minutes please call the above phone number  Palliative Medicine Team providers are available by phone from 7am to 7pm daily and can be  reached through the team cell phone.  Should this patient require assistance outside of these hours, please call the patient's attending physician.

## 2022-11-29 NOTE — Plan of Care (Signed)
  Problem: Education: Goal: Knowledge of General Education information will improve Description: Including pain rating scale, medication(s)/side effects and non-pharmacologic comfort measures Outcome: Progressing   Problem: Health Behavior/Discharge Planning: Goal: Ability to manage health-related needs will improve Outcome: Progressing   Problem: Clinical Measurements: Goal: Ability to maintain clinical measurements within normal limits will improve Outcome: Not Progressing Goal: Will remain free from infection Outcome: Progressing Goal: Diagnostic test results will improve Outcome: Not Progressing

## 2022-11-29 NOTE — Progress Notes (Signed)
[facility]                                                                               Debra Grimes, is a 82 y.o. female, DOB - [birth year], QMV:[id number] Admit date - 11/27/2022    Outpatient Primary MD for the patient is [facility]  LOS - 2  days    Brief summary    Debra Grimes is a 82 y.o. female with medical history significant of diabetes, hypertension, hyperlipidemia presents with several days of shortness of breath and generally feeling fairly poor, with bilateral lower extremity edema.  She was admitted for NSTEMI and acute CHF.   Assessment & Plan    Assessment and Plan:   Acute respiratory failure with hypoxia secondary to NSTEMI AND Acute systolic CHF/ Ischemic cardiomyopathy/ Severe MR on echocardiogram: On IV heparin and IV lasix 40 mg BID . Diuresed about 550 ml overnight.  Daily weights and strict intake and output. Echocardiogram done and reviewed with family.  Cardiology on board and plan for cath possibly tomorrow.  RVP panel is negative. CXR showing Cardiomegaly with central vascular prominence and mild-to-moderate interstitial edema with a basal gradient, with a mild interval worsening in the edema.   Acute hypervolemic hyponatremia:  Slowly improving with diuresis. Sodium of 125 today.  BMP every 12 hours.  TSH wnl. Serum osmo is 275, urine sodium is 54 , cortisol level 12.1, Urine osmolality is 355.  New onset of atrial fibrillation With soft BP parameters.  Rate control with amiodarone gtt.   AKI;  Suspect cardio renal syndrome rather than AKI from diuresis.  Metabolic acidosis non anion gap.  Creatinine stabilized at 1.3 with IV lasix.  Pt on sodium bicarb tablets.     Hyperkalemia:  Resolved with lokelma.  Repeat K WNL.    Type 2 DM;  With hyperglycemia CBG (last 3)  Recent Labs    11/28/22 2013 11/29/22 0805 11/29/22 1201  GLUCAP 132* 70 180*   Resume SSI for now.  Gabapentin for diabetic  neuropathy.  Hemoglobin A1c is 6.8%  GERD On IV Ppi BID.    Anemia of chronic disease Hemoglobin around 9.  Checking stool for occult blood.    Hypertension:  BP parameters are borderline low.    Hyperlipidemia:  Resume statin.   In view of her age, and multiple medical problems, and new ischemic cardiomyopathy, she has poor prognosis. Recommend palliative care consult.    Estimated body mass index is 30.44 kg/m as calculated from the following:   Height as of this encounter: 5\' 2"  (1.575 m).   Weight as of this encounter: 75.5 kg.  Code Status: DNR DVT Prophylaxis:  Heparin.    Level of Care: Level of care: Progressive Family Communication: None at bedside.   Disposition Plan:     Remains inpatient appropriate:  not medically stable.   Procedures:  Echocardiogram.  Consultants:   Cardiology Palliative care.   Antimicrobials:   Anti-infectives (From admission, onward)    None        Medications  Scheduled Meds:  amiodarone  150 mg Intravenous Once   furosemide  40 mg Intravenous BID  furosemide  40 mg Intravenous Once   gabapentin  300 mg Oral TID   insulin aspart  0-15 Units Subcutaneous TID WC   insulin aspart  0-5 Units Subcutaneous QHS   pantoprazole (PROTONIX) IV  40 mg Intravenous BID   simvastatin  40 mg Oral QPM   sodium bicarbonate  1,300 mg Oral BID   Continuous Infusions:  amiodarone     Followed by   amiodarone     heparin 1,000 Units/hr (11/29/22 0410)   PRN Meds:.acetaminophen **OR** acetaminophen, guaiFENesin-dextromethorphan, ondansetron **OR** ondansetron (ZOFRAN) IV, polyethylene glycol, prochlorperazine    Subjective:   Debra Grimes was seen and examined today.  She reports feeling much better. Still on [place] oxygen.    Objective:   Vitals:   11/29/22 1118 11/29/22 1119 11/29/22 1133 11/29/22 1203  BP: (!) 75/44 (!) 86/60 99/61 (!) 87/61  Pulse: (!) 109 (!) 154 92   Resp: 20  18 18   Temp:    98.2 F (36.8 C)   TempSrc:    Oral  SpO2: 99% 100% 98%   Weight:      Height:        Intake/Output Summary (Last 24 hours) at 11/29/2022 1254 Last data filed at 11/29/2022 1203 Gross per 24 hour  Intake 197.82 ml  Output 900 ml  Net -702.18 ml   Filed Weights   11/27/22 2035 11/28/22 0356 11/29/22 0332  Weight: 76 kg 74.4 kg 75.5 kg     Exam General exam: ill appearing elderly lady not in distress on [place] oxygen.  Respiratory system:  diminished at bases, no wheezing or rhonchi. On [place] oxygen.  Cardiovascular system: S1 & S2 heard, irregularly irregular, pedal edema improving.  Gastrointestinal system: Abdomen is nondistended, soft and nontender.  Central nervous system: Alert and oriented to place and person.  Extremities: pedal edema improving.  Skin: No rashes,  Psychiatry: Mood & affect appropriate.    Data Reviewed:  I have personally reviewed following labs and imaging studies   CBC Lab Results  Component Value Date   WBC 8.3 11/29/2022   RBC 2.97 (L) 11/29/2022   HGB 8.9 (L) 11/29/2022   HCT 26.9 (L) 11/29/2022   MCV 90.6 11/29/2022   MCH 30.0 11/29/2022   PLT 230 11/29/2022   MCHC 33.1 11/29/2022   RDW 14.3 11/29/2022   LYMPHSABS 1.7 08/21/2014   MONOABS 0.5 08/21/2014   EOSABS 0.2 08/21/2014   BASOSABS 0.0 08/21/2014     Last metabolic panel Lab Results  Component Value Date   NA 125 (L) 11/29/2022   K 4.3 11/29/2022   CL 95 (L) 11/29/2022   CO2 21 (L) 11/29/2022   BUN 28 (H) 11/29/2022   CREATININE 1.35 (H) 11/29/2022   GLUCOSE 75 11/29/2022   GFRNONAA 39 (L) 11/29/2022   GFRAA >60 12/25/2014   CALCIUM 8.5 (L) 11/29/2022   PROT 7.1 11/27/2022   ALBUMIN 3.5 11/27/2022   BILITOT 0.7 11/27/2022   ALKPHOS 42 11/27/2022   AST 20 11/27/2022   ALT 17 11/27/2022   ANIONGAP 9 11/29/2022    CBG (last 3)  Recent Labs    11/28/22 2013 11/29/22 0805 11/29/22 1201  GLUCAP 132* 70 180*      Coagulation Profile: No results for input(s): "INR", "PROTIME" in  the last 168 hours.   Radiology Studies: ECHOCARDIOGRAM COMPLETE  Result Date: 11/28/2022    ECHOCARDIOGRAM REPORT   Patient Name:   Debra Grimes Date of Exam: 11/28/2022 Medical Rec #:  [id number]  Height:       62.0 in Accession #:    [id number]       Weight:       164.0 lb Date of Birth:  31-Jan-1940        BSA:          1.757 m Patient Age:    81 years         BP:           103/65 mmHg Patient Gender: F                HR:           87 bpm. Exam Location:  Inpatient Procedure: 2D Echo, Cardiac Doppler and Color Doppler Indications:    NSTEMI. Acute systolic chf.  History:        Patient has no prior history of Echocardiogram examinations.                 Risk Factors:Diabetes, Hypertension and Dyslipidemia.  Sonographer:    Delcie Roch RDCS Referring Phys: [phone] JACOB J STINSON IMPRESSIONS  1. Left ventricular ejection fraction, by estimation, is 25 to 30%. The left ventricle has severely decreased function. The left ventricle demonstrates regional wall motion abnormalities (see scoring diagram/findings for description). There is mild left  ventricular hypertrophy. Left ventricular diastolic parameters are indeterminate.  2. Right ventricular systolic function is mildly reduced. The right ventricular size is normal. There is moderately elevated pulmonary artery systolic pressure. The estimated right ventricular systolic pressure is 47.3 mmHg.  3. Left atrial size was moderately dilated.  4. The mitral valve is degenerative. Moderate to severe mitral valve regurgitation. No evidence of mitral stenosis. Moderate mitral annular calcification.  5. The aortic valve is abnormal. There is moderate calcification of the aortic valve. There is moderate thickening of the aortic valve. Aortic valve regurgitation is not visualized. Aortic valve sclerosis/calcification is present, without any evidence of aortic stenosis.  6. The inferior vena cava is dilated in size with <50% respiratory variability,  suggesting right atrial pressure of 15 mmHg. FINDINGS  Left Ventricle: Left ventricular ejection fraction, by estimation, is 25 to 30%. The left ventricle has severely decreased function. The left ventricle demonstrates regional wall motion abnormalities. The left ventricular internal cavity size was normal  in size. There is mild left ventricular hypertrophy. Left ventricular diastolic function could not be evaluated due to mitral regurgitation (moderate or greater). Left ventricular diastolic parameters are indeterminate.  LV Wall Scoring: The mid and distal anterior septum, apical lateral segment, mid inferoseptal segment, apical anterior segment, and apical inferior segment are akinetic. The posterior wall, mid anterolateral segment, mid inferior segment, and basal inferoseptal segment are hypokinetic. Right Ventricle: The right ventricular size is normal. No increase in right ventricular wall thickness. Right ventricular systolic function is mildly reduced. There is moderately elevated pulmonary artery systolic pressure. The tricuspid regurgitant velocity is 2.84 m/s, and with an assumed right atrial pressure of 15 mmHg, the estimated right ventricular systolic pressure is 47.3 mmHg. Left Atrium: Left atrial size was moderately dilated. Right Atrium: Right atrial size was normal in size. Pericardium: There is no evidence of pericardial effusion. Mitral Valve: The mitral valve is degenerative in appearance. Moderate mitral annular calcification. Moderate to severe mitral valve regurgitation. No evidence of mitral valve stenosis. The mean mitral valve gradient is 2.4 mmHg. Tricuspid Valve: The tricuspid valve is normal in structure. Tricuspid valve regurgitation is mild . No evidence of tricuspid stenosis. Aortic Valve: The aortic  valve is abnormal. There is moderate calcification of the aortic valve. There is moderate thickening of the aortic valve. Aortic valve regurgitation is not visualized. Aortic valve  sclerosis/calcification is present, without any evidence of aortic stenosis. Aortic valve mean gradient measures 2.1 mmHg. Aortic valve peak gradient measures 4.1 mmHg. Aortic valve area, by VTI measures 1.83 cm. Pulmonic Valve: The pulmonic valve was normal in structure. Pulmonic valve regurgitation is trivial. No evidence of pulmonic stenosis. Aorta: The aortic root is normal in size and structure. Venous: The inferior vena cava is dilated in size with less than 50% respiratory variability, suggesting right atrial pressure of 15 mmHg. IAS/Shunts: The atrial septum is grossly normal.  LEFT VENTRICLE PLAX 2D LVIDd:         5.20 cm      Diastology LVIDs:         4.50 cm      LV e' medial:    5.11 cm/s LV PW:         0.90 cm      LV E/e' medial:  24.1 LV IVS:        1.00 cm      LV e' lateral:   5.87 cm/s LVOT diam:     1.70 cm      LV E/e' lateral: 21.0 LV SV:         30 LV SV Index:   17 LVOT Area:     2.27 cm  LV Volumes (MOD) LV vol d, MOD A4C: 106.0 ml LV vol s, MOD A4C: 86.0 ml LV SV MOD A4C:     106.0 ml RIGHT VENTRICLE          IVC RV Basal diam:  2.90 cm  IVC diam: 2.40 cm TAPSE (M-mode): 1.5 cm LEFT ATRIUM             Index        RIGHT ATRIUM           Index LA diam:        4.40 cm 2.50 cm/m   RA Area:     13.10 cm LA Vol (A2C):   90.6 ml 51.56 ml/m  RA Volume:   31.00 ml  17.64 ml/m LA Vol (A4C):   67.9 ml 38.64 ml/m LA Biplane Vol: 79.5 ml 45.24 ml/m  AORTIC VALVE AV Area (Vmax):    1.81 cm AV Area (Vmean):   1.67 cm AV Area (VTI):     1.83 cm AV Vmax:           101.24 cm/s AV Vmean:          69.029 cm/s AV VTI:            0.163 m AV Peak Grad:      4.1 mmHg AV Mean Grad:      2.1 mmHg LVOT Vmax:         80.60 cm/s LVOT Vmean:        50.800 cm/s LVOT VTI:          0.131 m LVOT/AV VTI ratio: 0.80  AORTA Ao Root diam: 2.90 cm Ao Asc diam:  3.10 cm MITRAL VALVE                  TRICUSPID VALVE MV Area (PHT): 5.97 cm       TR Peak grad:   32.3 mmHg MV Mean grad:  2.4 mmHg       TR Vmax:  284.00 cm/s MV Decel Time: 127 msec MR Peak grad:    62.4 mmHg    SHUNTS MR Mean grad:    39.0 mmHg    Systemic VTI:  0.13 m MR Vmax:         395.00 cm/s  Systemic Diam: 1.70 cm MR Vmean:        297.0 cm/s MR PISA:         2.26 cm MR PISA Eff ROA: 24 mm MR PISA Radius:  0.60 cm MV E velocity: 123.00 cm/s MV A velocity: 63.10 cm/s MV E/A ratio:  1.95 Weston Brass MD Electronically signed by Weston Brass MD Signature Date/Time: 11/28/2022/12:17:39 PM    Final    DG CHEST PORT 1 VIEW  Result Date: 11/28/2022 CLINICAL DATA:  200808 with hypoxia. EXAM: PORTABLE CHEST 1 VIEW COMPARISON:  Portable chest yesterday at 2:05 p.m. FINDINGS: 4:03 a.m. The heart is enlarged. There is central vascular prominence, mild-to-moderate interstitial edema with a basal gradient, and a mild interval worsening in the edema. Small pleural effusions appear similar. There is overlying hazy atelectasis or consolidation. No other focal lung opacity is seen. The mediastinum is stable. Aortic atherosclerosis. No new skeletal findings. IMPRESSION: 1. Cardiomegaly with central vascular prominence and mild-to-moderate interstitial edema with a basal gradient, with a mild interval worsening in the edema. 2. Small pleural effusions with overlying hazy atelectasis or consolidation appear similar. Electronically Signed   By: Almira Bar M.D.   On: 11/28/2022 04:41   DG Chest Port 1 View  Result Date: 11/27/2022 CLINICAL DATA:  Shortness of breath and bilateral lower extremity swelling. EXAM: PORTABLE CHEST 1 VIEW COMPARISON:  Chest radiograph dated 12/14/2021. FINDINGS: There is mild cardiomegaly with vascular congestion and edema. Small bilateral pleural effusions and bibasilar atelectasis. Pneumonia is not excluded. No pneumothorax. Atherosclerotic calcification of the aorta. No acute osseous pathology. IMPRESSION: Congestive heart failure with small bilateral pleural effusions and bibasilar atelectasis. Electronically Signed   By:  Elgie Collard M.D.   On: 11/27/2022 17:11       Kathlen Mody M.D. [facility] 11/29/2022, 12:54 PM  Available via Epic secure chat 7am-7pm After 7 pm, please refer to night coverage provider listed on amion.

## 2022-11-29 NOTE — Progress Notes (Signed)
Pt was moved to dangle at edge of bed by family. Pt HR increased and this RN came to check on pt. Found to be in Afib RVR. Physician and Cardiology notified. NO new orders yet. Plan of care continues.

## 2022-11-29 NOTE — Progress Notes (Signed)
PHARMACY - ANTICOAGULATION CONSULT NOTE  Pharmacy Consult for heparin Indication: chest pain/ACS  Allergies  Allergen Reactions   Hydrocodone    Oxycodone-Acetaminophen    Penicillins     Patient Measurements: Height: 5\' 2"  (157.5 cm) Weight: 75.5 kg (166 lb 7.2 oz) IBW/kg (Calculated) : 50.1 Heparin Dosing Weight: 68.7kg  Vital Signs: Temp: 97.9 F (36.6 C) (11/17 0332) Temp Source: Oral (11/17 0332) BP: 100/64 (11/17 0332) Pulse Rate: 75 (11/17 0332)  Labs: Recent Labs    11/27/22 1400 11/27/22 1406 11/28/22 0136 11/28/22 0314 11/28/22 0315 11/28/22 0719 11/28/22 0831 11/28/22 1211 11/28/22 1759 11/28/22 2049 11/29/22 0555  HGB 9.8*  --   --   --  9.2*  --   --   --   --   --  8.9*  HCT 29.1*  --   --   --  27.8*  --   --   --   --   --  26.9*  PLT 244  --   --   --  228  --   --   --   --   --  230  HEPARINUNFRC  --   --   --    < >  --   --   --  0.35  --  0.36 0.56  CREATININE 1.27*  --   --   --   --  1.19* 1.34* 1.38* 1.34*  --   --   TROPONINIHS  --    < > 1,889*  --   --  1,425* 1,433*  --   --   --   --    < > = values in this interval not displayed.    Estimated Creatinine Clearance: 30.3 mL/min (A) (by C-G formula based on SCr of 1.34 mg/dL (H)).   Medical History: Past Medical History:  Diagnosis Date   Arthritis    Hearing loss    Hypercholesteremia    Hypertension    Macular degeneration    Type 2 diabetes mellitus (HCC)    Assessment: 82 year old female admitted to [facility] with leg swelling and shortness of breath. Cardiac enzymes elevated. New orders to start IV heparin for rule out ACS. Patient does not appear to be on anticoagulation prior to admit.   Heparin level therapeutic at 0.56 on 1000 units/hr. Hgb 8.9, plt wnl. No signs of bleeding noted.   Goal of Therapy:  Heparin level 0.3-0.7 units/ml Monitor platelets by anticoagulation protocol: Yes   Plan:  Continue heparin 1000 units/hr  Daily heparin level and CBC  Monitor for  s/sx of bleeding    Thank you for involving pharmacy in the patient's care.   Theotis Burrow, PharmD PGY1 Acute Care Pharmacy Resident  11/29/2022 6:25 AM

## 2022-11-30 ENCOUNTER — Encounter (HOSPITAL_COMMUNITY)
Admission: EM | Disposition: A | Discharge status: Hospice | Payer: Self-pay | Source: Home / Self Care | Attending: Internal Medicine

## 2022-11-30 ENCOUNTER — Inpatient Hospital Stay (HOSPITAL_COMMUNITY): Payer: Medicare Other

## 2022-11-30 DIAGNOSIS — I4891 Unspecified atrial fibrillation: Secondary | ICD-10-CM | POA: Diagnosis not present

## 2022-11-30 DIAGNOSIS — E7800 Pure hypercholesterolemia, unspecified: Secondary | ICD-10-CM | POA: Diagnosis not present

## 2022-11-30 DIAGNOSIS — E119 Type 2 diabetes mellitus without complications: Secondary | ICD-10-CM | POA: Diagnosis not present

## 2022-11-30 DIAGNOSIS — I5021 Acute systolic (congestive) heart failure: Secondary | ICD-10-CM | POA: Diagnosis not present

## 2022-11-30 DIAGNOSIS — Z515 Encounter for palliative care: Secondary | ICD-10-CM | POA: Diagnosis not present

## 2022-11-30 DIAGNOSIS — I214 Non-ST elevation (NSTEMI) myocardial infarction: Secondary | ICD-10-CM | POA: Diagnosis not present

## 2022-11-30 HISTORY — PX: RIGHT HEART CATH: CATH118263

## 2022-11-30 LAB — POCT I-STAT EG7
Acid-base deficit: 4 mmol/L — ABNORMAL HIGH (ref 0.0–2.0)
Acid-base deficit: 4 mmol/L — ABNORMAL HIGH (ref 0.0–2.0)
Bicarbonate: 20.7 mmol/L (ref 20.0–28.0)
Bicarbonate: 20.9 mmol/L (ref 20.0–28.0)
Calcium, Ion: 1.13 mmol/L — ABNORMAL LOW (ref 1.15–1.40)
Calcium, Ion: 1.14 mmol/L — ABNORMAL LOW (ref 1.15–1.40)
HCT: 28 % — ABNORMAL LOW (ref 36.0–46.0)
HCT: 28 % — ABNORMAL LOW (ref 36.0–46.0)
Hemoglobin: 9.5 g/dL — ABNORMAL LOW (ref 12.0–15.0)
Hemoglobin: 9.5 g/dL — ABNORMAL LOW (ref 12.0–15.0)
O2 Saturation: 58 %
O2 Saturation: 60 %
Potassium: 3.9 mmol/L (ref 3.5–5.1)
Potassium: 3.9 mmol/L (ref 3.5–5.1)
Sodium: 124 mmol/L — ABNORMAL LOW (ref 135–145)
Sodium: 124 mmol/L — ABNORMAL LOW (ref 135–145)
TCO2: 22 mmol/L (ref 22–32)
TCO2: 22 mmol/L (ref 22–32)
pCO2, Ven: 35.3 mm[Hg] — ABNORMAL LOW (ref 44–60)
pCO2, Ven: 35.7 mm[Hg] — ABNORMAL LOW (ref 44–60)
pH, Ven: 7.375 (ref 7.25–7.43)
pH, Ven: 7.376 (ref 7.25–7.43)
pO2, Ven: 31 mm[Hg] — CL (ref 32–45)
pO2, Ven: 32 mm[Hg] (ref 32–45)

## 2022-11-30 LAB — URINALYSIS, ROUTINE W REFLEX MICROSCOPIC
Bilirubin Urine: NEGATIVE
Glucose, UA: NEGATIVE mg/dL
Hgb urine dipstick: NEGATIVE
Ketones, ur: NEGATIVE mg/dL
Nitrite: NEGATIVE
Protein, ur: NEGATIVE mg/dL
Specific Gravity, Urine: 1.014 (ref 1.005–1.030)
pH: 5 (ref 5.0–8.0)

## 2022-11-30 LAB — CBC
HCT: 26.8 % — ABNORMAL LOW (ref 36.0–46.0)
Hemoglobin: 8.9 g/dL — ABNORMAL LOW (ref 12.0–15.0)
MCH: 29.9 pg (ref 26.0–34.0)
MCHC: 33.2 g/dL (ref 30.0–36.0)
MCV: 89.9 fL (ref 80.0–100.0)
Platelets: 236 10*3/uL (ref 150–400)
RBC: 2.98 MIL/uL — ABNORMAL LOW (ref 3.87–5.11)
RDW: 14.1 % (ref 11.5–15.5)
WBC: 8.6 10*3/uL (ref 4.0–10.5)
nRBC: 0 % (ref 0.0–0.2)

## 2022-11-30 LAB — BASIC METABOLIC PANEL
Anion gap: 10 (ref 5–15)
Anion gap: 13 (ref 5–15)
BUN: 37 mg/dL — ABNORMAL HIGH (ref 8–23)
BUN: 36 mg/dL — ABNORMAL HIGH (ref 8–23)
CO2: 19 mmol/L — ABNORMAL LOW (ref 22–32)
CO2: 19 mmol/L — ABNORMAL LOW (ref 22–32)
Calcium: 8.5 mg/dL — ABNORMAL LOW (ref 8.9–10.3)
Calcium: 8.4 mg/dL — ABNORMAL LOW (ref 8.9–10.3)
Chloride: 92 mmol/L — ABNORMAL LOW (ref 98–111)
Chloride: 91 mmol/L — ABNORMAL LOW (ref 98–111)
Creatinine, Ser: 1.75 mg/dL — ABNORMAL HIGH (ref 0.44–1.00)
Creatinine, Ser: 1.54 mg/dL — ABNORMAL HIGH (ref 0.44–1.00)
GFR, Estimated: 29 mL/min — ABNORMAL LOW (ref >60)
GFR, Estimated: 33 mL/min — ABNORMAL LOW (ref >60)
Glucose, Bld: 127 mg/dL — ABNORMAL HIGH (ref 70–99)
Glucose, Bld: 119 mg/dL — ABNORMAL HIGH (ref 70–99)
Potassium: 4.1 mmol/L (ref 3.5–5.1)
Potassium: 4 mmol/L (ref 3.5–5.1)
Sodium: 121 mmol/L — ABNORMAL LOW (ref 135–145)
Sodium: 123 mmol/L — ABNORMAL LOW (ref 135–145)

## 2022-11-30 LAB — GLUCOSE, CAPILLARY
Glucose-Capillary: 121 mg/dL — ABNORMAL HIGH (ref 70–99)
Glucose-Capillary: 139 mg/dL — ABNORMAL HIGH (ref 70–99)
Glucose-Capillary: 123 mg/dL — ABNORMAL HIGH (ref 70–99)
Glucose-Capillary: 143 mg/dL — ABNORMAL HIGH (ref 70–99)

## 2022-11-30 LAB — SODIUM, URINE, RANDOM: Sodium, Ur: 51 mmol/L

## 2022-11-30 LAB — OSMOLALITY, URINE: Osmolality, Ur: 423 mosm/kg (ref 300–900)

## 2022-11-30 LAB — HEPARIN LEVEL (UNFRACTIONATED): Heparin Unfractionated: 0.61 [IU]/mL (ref 0.30–0.70)

## 2022-11-30 SURGERY — RIGHT HEART CATH
Anesthesia: LOCAL

## 2022-11-30 MED ORDER — ASPIRIN 81 MG PO TBEC
81.0000 mg | DELAYED_RELEASE_TABLET | Freq: Every day | ORAL | Status: DC
  Administered 2022-11-30 – 2022-12-04 (×5): 81 mg via ORAL
  Filled 2022-11-30 (×5): qty 1

## 2022-11-30 MED ORDER — SODIUM CHLORIDE 0.9% FLUSH
10.0000 mL | Freq: Two times a day (BID) | INTRAVENOUS | Status: DC
  Administered 2022-11-30 – 2022-12-04 (×7): 10 mL via INTRAVENOUS

## 2022-11-30 MED ORDER — FUROSEMIDE 10 MG/ML IJ SOLN
80.0000 mg | Freq: Once | INTRAMUSCULAR | Status: AC
  Administered 2022-11-30: 80 mg via INTRAVENOUS
  Filled 2022-11-30: qty 8

## 2022-11-30 MED ORDER — LIDOCAINE HCL (PF) 1 % IJ SOLN
INTRAMUSCULAR | Status: AC
  Filled 2022-11-30: qty 30

## 2022-11-30 MED ORDER — FUROSEMIDE 10 MG/ML IJ SOLN: 80.0000 mg | Freq: Once | INTRAMUSCULAR | Status: DC

## 2022-11-30 MED ORDER — HEPARIN (PORCINE) IN NACL 1000-0.9 UT/500ML-% IV SOLN
INTRAVENOUS | Status: DC | PRN
  Administered 2022-11-30: 500 mL

## 2022-11-30 MED ORDER — FUROSEMIDE 10 MG/ML IJ SOLN: 80.0000 mg | Freq: Two times a day (BID) | INTRAMUSCULAR | Status: DC

## 2022-11-30 MED ORDER — POTASSIUM CHLORIDE CRYS ER 20 MEQ PO TBCR
40.0000 meq | EXTENDED_RELEASE_TABLET | Freq: Once | ORAL | Status: AC
  Administered 2022-11-30: 40 meq via ORAL
  Filled 2022-11-30: qty 2

## 2022-11-30 MED ORDER — FUROSEMIDE 10 MG/ML IJ SOLN
80.0000 mg | Freq: Two times a day (BID) | INTRAMUSCULAR | Status: DC
  Administered 2022-11-30 – 2022-12-02 (×4): 80 mg via INTRAVENOUS
  Filled 2022-11-30 (×4): qty 8

## 2022-11-30 MED ORDER — ACETAZOLAMIDE ER 500 MG PO CP12
500.0000 mg | ORAL_CAPSULE | Freq: Once | ORAL | Status: AC
  Administered 2022-11-30: 500 mg via ORAL
  Filled 2022-11-30: qty 1

## 2022-11-30 MED ORDER — LIDOCAINE HCL (PF) 1 % IJ SOLN
INTRAMUSCULAR | Status: DC | PRN
  Administered 2022-11-30: 2 mL

## 2022-11-30 MED ORDER — SODIUM BICARBONATE 650 MG PO TABS
650.0000 mg | ORAL_TABLET | Freq: Two times a day (BID) | ORAL | Status: DC
  Administered 2022-11-30 – 2022-12-04 (×8): 650 mg via ORAL
  Filled 2022-11-30 (×8): qty 1

## 2022-11-30 SURGICAL SUPPLY — 6 items
CATH BALLN WEDGE 5F 110CM (CATHETERS) IMPLANT
PACK CARDIAC CATHETERIZATION (CUSTOM PROCEDURE TRAY) ×2 IMPLANT
SHEATH GLIDE SLENDER 4/5FR (SHEATH) IMPLANT
TRANSDUCER W/STOPCOCK (MISCELLANEOUS) IMPLANT
TUBING ART PRESS 72 MALE/FEM (TUBING) IMPLANT
WIRE EMERALD 3MM-J .025X260CM (WIRE) IMPLANT

## 2022-11-30 NOTE — Consult Note (Signed)
Advanced Heart Failure Team Consult Note   Primary Physician: [facility] PCP-Cardiologist:  None  Reason for Consultation: Acute systolic CHF  HPI:    Debra Grimes is seen today for evaluation of acute systolic CHF at the request of Dr. Jacques Navy with [facility]. 82 y.o. female with history of DM, HTN, hypercholesterolemia. No prior cardiac history.   Presented to the ED 11/27/22 with several days of epigastric discomfort, worsening dyspnea and lower extremity edema. Lab significant for Scr 1.27, Na 121, Cl 93, CO2 15, Hgb 9.8, HS troponin up to 1,889 with overall flat trend, BNP 1369. ECG with SR 89 bmp, IVCD, poor R wave progression in precordial leads, ? Mild ST elevation II, III, aVF, aVR w/ ST depression I and aVL. Recent inferior infarct with late presentation suspected. Evidence of CHF on CXR. She was admitted for management of NSTEMI and acute CHF. She was started on heparin gtt and IV lasix. Echo with EF 25-30%, RV mildly reduced, RVSP 47 mmHg, moderate to severe MR, dilated IVC.  Cardiology consulted. Diuresis has been sluggish with IV lasix 40 BID. GDMT and diuretic dosing limited by hypotension. She was started on midodrine. Is/Os not complete. Scr slowly trending up 1.19>>1.75 over last few days.  Went into atrial fibrillation yesterday and converted to SR with IV amiodarone.  Schedued for RHC today d/t concern for low-output HF. Advanced Heart Failure asked to see to assist with workup/management.  Prior to admission she lived alone and was independent in ADLs. She had been driving and doing her own grocery shopping. Lives in [place].  Home Medications Prior to Admission medications   Medication Sig Start Date End Date Taking? Authorizing Provider  amLODipine (NORVASC) 5 MG tablet Take 5 mg by mouth daily.   Yes [provider]  Calcium Citrate-Vitamin D (CITRACAL + D PO) Take 1 tablet by mouth daily.   Yes [provider]   gabapentin (NEURONTIN) 300 MG capsule Take 300 mg by mouth 3 (three) times daily.   Yes [provider]  glimepiride (AMARYL) 4 MG tablet Take 4 mg by mouth 2 (two) times daily.   Yes [provider]  losartan (COZAAR) 100 MG tablet Take 100 mg by mouth daily.   Yes [provider]  metFORMIN (GLUCOPHAGE) 1000 MG tablet Take 1,000 mg by mouth 2 (two) times daily with a meal.   Yes [provider]  risedronate (ACTONEL) 150 MG tablet Take 150 mg by mouth every 30 (thirty) days. Takes on the 19th of the month. with water on empty stomach, nothing by mouth or lie down for next 30 minutes.   Yes [provider]  simvastatin (ZOCOR) 40 MG tablet Take 40 mg by mouth every evening.   Yes [provider]    Past Medical History: Past Medical History:  Diagnosis Date   Arthritis    Hearing loss    Hypercholesteremia    Hypertension    Macular degeneration    Type 2 diabetes mellitus (HCC)     Past Surgical History: Past Surgical History:  Procedure Laterality Date   CATARACT EXTRACTION W/PHACO Right 08/27/2014   Procedure: CATARACT EXTRACTION PHACO AND INTRAOCULAR LENS PLACEMENT (IOC);  Surgeon: Susa Simmonds, MD;  Location: [facility];  Service: Ophthalmology;  Laterality: Right;  CDE: 33.79   CATARACT EXTRACTION W/PHACO Left 12/31/2014   Procedure: CATARACT EXTRACTION PHACO AND INTRAOCULAR LENS PLACEMENT (IOC);  Surgeon: Susa Simmonds, MD;  Location: [facility];  Service: Ophthalmology;  Laterality: Left;  CDE 8.06   ORIF ANKLE FRACTURE Left    PATELLA FRACTURE SURGERY Right     Family History: Family History  Problem Relation Age of Onset   Hypertension Father     Social History: Social History   Socioeconomic History   Marital status: Widowed    Spouse name: Not on file   Number of children: Not on file   Years of education: Not on file   Highest education level: Not on file  Occupational History   Not on file  Tobacco Use    Smoking status: Never   Smokeless tobacco: Never  Substance and Sexual Activity   Alcohol use: No   Drug use: No   Sexual activity: Never    Birth control/protection: None, Post-menopausal  Other Topics Concern   Not on file  Social History Narrative   Not on file   Social Determinants of Health   Financial Resource Strain: Not on file  Food Insecurity: No Food Insecurity (11/27/2022)   Hunger Vital Sign    Worried About Running Out of Food in the Last Year: Never true    Ran Out of Food in the Last Year: Never true  Transportation Needs: No Transportation Needs (11/27/2022)   PRAPARE - Administrator, Civil Service (Medical): No    Lack of Transportation (Non-Medical): No  Physical Activity: Not on file  Stress: Not on file  Social Connections: Not on file    Allergies:  Allergies  Allergen Reactions   Hydrocodone    Oxycodone-Acetaminophen    Penicillins     Objective:    Vital Signs:   Temp:  [97.7 F (36.5 C)-98.4 F (36.9 C)] 98.1 F (36.7 C) (11/18 1131) Pulse Rate:  [65-99] 73 (11/18 1131) Resp:  [16-18] 16 (11/18 1131) BP: (74-106)/(42-67) 106/60 (11/18 1131) SpO2:  [96 %-99 %] 96 % (11/18 1131) Weight:  [75.6 kg] 75.6 kg (11/18 0322) Last BM Date : 11/27/22  Weight change: Filed Weights   11/28/22 0356 11/29/22 0332 11/30/22 0322  Weight: 74.4 kg 75.5 kg 75.6 kg    Intake/Output:   Intake/Output Summary (Last 24 hours) at 11/30/2022 1135 Last data filed at 11/30/2022 0442 Gross per 24 hour  Intake 511.63 ml  Output 550 ml  Net -38.37 ml      Physical Exam    General:  Fatigued appearing elderly female HEENT: HOH Neck: supple. JVP to ear. Carotids 2+ bilat; no bruits.  Cor: PMI nondisplaced. Regular rate & rhythm. No rubs, gallops, 2/6 MR murmur Lungs: bibasilar crackles Abdomen: soft, nontender, nondistended.  Extremities: no cyanosis, clubbing, rash, 1+ edema, legs are cool Neuro: alert & orientedx3. moves all 4  extremities w/o difficulty. Affect pleasant   Telemetry   NSR 70s this am  EKG   SR 89 bmp, IVCD, poor R wave progression in precordial leads, ? Mild ST elevation II, III, aVF, aVR w/ ST depression I and aVL  Labs   Basic Metabolic Panel: Recent Labs  Lab 11/28/22 1211 11/28/22 1759 11/29/22 0555 11/29/22 1800 11/30/22 0737  NA 122* 125* 125* 123* 121*  K 5.8* 5.3* 4.3 4.8 4.1  CL 96* 97* 95* 93* 92*  CO2 16* 18* 21* 20* 19*  GLUCOSE 144* 91 75 222* 127*  BUN 31* 30* 28* 31* 37*  CREATININE 1.38* 1.34* 1.35* 1.51* 1.75*  CALCIUM 8.3* 8.8* 8.5* 8.5* 8.5*    Liver Function Tests: Recent Labs  Lab 11/27/22 1400  AST  20  ALT 17  ALKPHOS 42  BILITOT 0.7  PROT 7.1  ALBUMIN 3.5   No results for input(s): "LIPASE", "AMYLASE" in the last 168 hours. No results for input(s): "AMMONIA" in the last 168 hours.  CBC: Recent Labs  Lab 11/27/22 1400 11/28/22 0315 11/29/22 0555 11/30/22 0737  WBC 8.5 10.2 8.3 8.6  HGB 9.8* 9.2* 8.9* 8.9*  HCT 29.1* 27.8* 26.9* 26.8*  MCV 90.7 89.7 90.6 89.9  PLT 244 228 230 236    Cardiac Enzymes: No results for input(s): "CKTOTAL", "CKMB", "CKMBINDEX", "TROPONINI" in the last 168 hours.  BNP: BNP (last 3 results) Recent Labs    11/27/22 1401  BNP 1,369.0*    ProBNP (last 3 results) No results for input(s): "PROBNP" in the last 8760 hours.   CBG: Recent Labs  Lab 11/29/22 1201 11/29/22 1616 11/29/22 2052 11/30/22 0743 11/30/22 1129  GLUCAP 180* 166* 162* 121* 139*    Coagulation Studies: No results for input(s): "LABPROT", "INR" in the last 72 hours.   Imaging   No results found.   Medications:     Current Medications:  furosemide  40 mg Intravenous Once   gabapentin  300 mg Oral TID   insulin aspart  0-15 Units Subcutaneous TID WC   insulin aspart  0-5 Units Subcutaneous QHS   midodrine  10 mg Oral BID WC   pantoprazole (PROTONIX) IV  40 mg Intravenous BID   simvastatin  40 mg Oral QPM   sodium  bicarbonate  650 mg Oral BID    Infusions:  amiodarone 30 mg/hr (11/30/22 0714)   heparin 1,000 Units/hr (11/30/22 0442)      Patient Profile   82 y.o. female with no prior cardiac history. Known history of HTN, DM, HLD. Admitted with acute systolic CHF and NSTEMI.   Assessment/Plan   Acute systolic CHF: -Suspect ischemic cardiomyopathy -Echo: EF 25-30%, RWMA, RV mildly reduced, moderate to severe MR -NYHA IV. Volume overloaded. Diuretics intermittently held d/t hypotension. Give 80 mg lasix IV now.  -RHC today to definitively assess hemodynamics. Suspect low-output HF.  CO2 15 on initial presentation, 19 today -May need inotrope support with milrinone +/-NE. She is currently on midodrine 10 TID with SBP 90s-low 100s. -GDMT limited by hypotension/AKI  2. NSTEMI: -Suspect late presentation MI -HS troponin ~ 1800 with flat trend -On simvastatin. ? Starting aspirin. On heparin gtt -Consider LHC once more stable  3. Mitral regurgitation: -Moderate to severe on echo -? Ischemic MR  4. AKI: -Scr baseline uncertain, 1.27 on admit -Scr up to 1.75 today -In setting of volume overload and suspected low-output HF  5. DM: -A1c 6.8% - Per primary  6. Hypervolemic hyponatremia -Na 121 -Diurese aggressively  7. Atrial fibrillation -New onset 11/17 -Converted to SR with IV amiodarone -Continue amiodarone at 30/hr  8. Anemia -Hgb 8s-9s -Follow with anticoagulation  GOC:  -Palliative Care on board.  -Now DNR/DNI.  -Depending on her progress over the next few days may need to consider transition to comfort measures   Length of Stay: 3  Keevon Henney N, PA-C  11/30/2022, 11:35 AM  Advanced Heart Failure Team Pager [phone] (M-F; 7a - 5p)  Please contact [facility] for night-coverage after hours (4p -7a ) and weekends on [email or web address]

## 2022-11-30 NOTE — Plan of Care (Signed)
  Problem: Education: Goal: Knowledge of General Education information will improve Description: Including pain rating scale, medication(s)/side effects and non-pharmacologic comfort measures Outcome: Progressing   Problem: Clinical Measurements: Goal: Ability to maintain clinical measurements within normal limits will improve Outcome: Progressing Goal: Will remain free from infection Outcome: Progressing Goal: Diagnostic test results will improve Outcome: Progressing   Problem: Activity: Goal: Risk for activity intolerance will decrease Outcome: Progressing   Problem: Nutrition: Goal: Adequate nutrition will be maintained Outcome: Progressing   Problem: Pain Management: Goal: General experience of comfort will improve Outcome: Progressing   Problem: Skin Integrity: Goal: Risk for impaired skin integrity will decrease Outcome: Progressing

## 2022-11-30 NOTE — Consult Note (Addendum)
[facility] Nephrology Consultation Note  Requesting MD: Dr. Rod Can Reason for consult: AKI  HPI:  Debra Grimes is a 82 y.o. female with past medical history significant for hypertension, hearing loss, HLD, type II DM, presented to the ER on 11/27/2022 because of peripheral edema and worsening dyspnea, seen as a consultation for the evaluation of AKI. On admission, the creatinine level was 1.27, sodium level 121, metabolic acidosis and elevated BNP with EKG changes.  Patient was admitted for an NSTEMI and CHF distribution.  She was started on IV Lasix and heparin drip.  Echo showed a EF of 25 to 30% with RV dysfunction and elevated pressure, moderate to severe MR.  The patient underwent right heart cath earlier today. During this hospitalization the sodium level dropped 119 on 11/16.  With the use of diuretics.  Serum sodium level was 121, creatinine level further elevated to 1.75 today. Noted the patient has been hypotensive during hospitalization.  She got Lasix 80 mg IV earlier today.  Added midodrine for hypotension by primary team. She is currently in the room air, earlier she was on 3 L which was tapered down by the nursing staff. The patient said that her breathing is better.  Denies nausea vomiting chest pain.  Review of system is limited because of very hard of hearing. No UA or US renal available to review. The last blood work patient had from 2016 when creatinine level was around 0.9.  After 8 years, 6 presented with creatinine level around 1.2.  The recent baseline serum creatinine level unknown.  I think she has some underlying CKD.  PMHx:   Past Medical History:  Diagnosis Date   Arthritis    Hearing loss    Hypercholesteremia    Hypertension    Macular degeneration    Type 2 diabetes mellitus (HCC)     Past Surgical History:  Procedure Laterality Date   CATARACT EXTRACTION W/PHACO Right 08/27/2014   Procedure: CATARACT EXTRACTION PHACO AND  INTRAOCULAR LENS PLACEMENT (IOC);  Surgeon: Susa Simmonds, MD;  Location: [facility];  Service: Ophthalmology;  Laterality: Right;  CDE: 33.79   CATARACT EXTRACTION W/PHACO Left 12/31/2014   Procedure: CATARACT EXTRACTION PHACO AND INTRAOCULAR LENS PLACEMENT (IOC);  Surgeon: Susa Simmonds, MD;  Location: [facility];  Service: Ophthalmology;  Laterality: Left;  CDE 8.06   ORIF ANKLE FRACTURE Left    PATELLA FRACTURE SURGERY Right     Family Hx:  Family History  Problem Relation Age of Onset   Hypertension Father     Social History:  reports that she has never smoked. She has never used smokeless tobacco. She reports that she does not drink alcohol and does not use drugs.  Allergies:  Allergies  Allergen Reactions   Hydrocodone    Oxycodone-Acetaminophen    Penicillins     Medications: Prior to Admission medications   Medication Sig Start Date End Date Taking? Authorizing Provider  amLODipine (NORVASC) 5 MG tablet Take 5 mg by mouth daily.   Yes [provider]  Calcium Citrate-Vitamin D (CITRACAL + D PO) Take 1 tablet by mouth daily.   Yes [provider]  gabapentin (NEURONTIN) 300 MG capsule Take 300 mg by mouth 3 (three) times daily.   Yes [provider]  glimepiride (AMARYL) 4 MG tablet Take 4 mg by mouth 2 (two) times daily.   Yes [provider]  losartan (COZAAR) 100 MG tablet Take 100 mg by mouth daily.   Yes  [provider]  metFORMIN (GLUCOPHAGE) 1000 MG tablet Take 1,000 mg by mouth 2 (two) times daily with a meal.   Yes [provider]  risedronate (ACTONEL) 150 MG tablet Take 150 mg by mouth every 30 (thirty) days. Takes on the 19th of the month. with water on empty stomach, nothing by mouth or lie down for next 30 minutes.   Yes [provider]  simvastatin (ZOCOR) 40 MG tablet Take 40 mg by mouth every evening.   Yes [provider]    I have reviewed the patient's current  medications.  Labs: Renal Panel: Recent Labs  Lab 11/28/22 1211 11/28/22 1759 11/29/22 0555 11/29/22 1800 11/30/22 0737  NA 122* 125* 125* 123* 121*  K 5.8* 5.3* 4.3 4.8 4.1  CL 96* 97* 95* 93* 92*  CO2 16* 18* 21* 20* 19*  GLUCOSE 144* 91 75 222* 127*  BUN 31* 30* 28* 31* 37*  CREATININE 1.38* 1.34* 1.35* 1.51* 1.75*  CALCIUM 8.3* 8.8* 8.5* 8.5* 8.5*     CBC:    Latest Ref Rng & Units 11/30/2022    7:37 AM 11/29/2022    5:55 AM 11/28/2022    3:15 AM  CBC  WBC 4.0 - 10.5 K/uL 8.6  8.3  10.2   Hemoglobin 12.0 - 15.0 g/dL 8.9  8.9  9.2   Hematocrit 36.0 - 46.0 % 26.8  26.9  27.8   Platelets 150 - 400 K/uL 236  230  228      Anemia Panel:  Recent Labs    11/27/22 1400 11/28/22 0315 11/29/22 0555 11/30/22 0737  HGB 9.8* 9.2* 8.9* 8.9*  MCV 90.7 89.7 90.6 89.9    Recent Labs  Lab 11/27/22 1400  AST 20  ALT 17  ALKPHOS 42  BILITOT 0.7  PROT 7.1  ALBUMIN 3.5    Lab Results  Component Value Date   HGBA1C 6.8 (H) 11/27/2022    ROS:  Pertinent items noted in HPI and remainder of comprehensive ROS otherwise negative.  Physical Exam: Vitals:   11/30/22 1536 11/30/22 1553  BP:  (!) 99/58  Pulse: (!) 0   Resp:  16  Temp:  98.1 F (36.7 C)  SpO2:       General exam: Appears calm and comfortable, hard of hearing Respiratory system: Clear to auscultation. Respiratory effort normal. No wheezing or crackle Cardiovascular system: S1 & S2 heard, RRR. Trace pedal edema+ Gastrointestinal system: Abdomen is nondistended, soft and nontender. Normal bowel sounds heard. Central nervous system: Alert and oriented. No focal neurological deficits. Extremities: no cyanosis or clubbing, trace edema+ Skin: No rashes, lesions or ulcers Psychiatry: alert, awake and following commands.   Assessment/Plan:  # Acute kidney injury presumably cardiorenal syndrome with acute systolic CHF concomitant with persistent hypotension with reduced renal perfusion. Check UA, US  renal, bladder scan Patient received IV Lasix earlier.  Monitor urine output. She had right heart cath earlier today, follow report this will help decide thevolume status and the need for diuretics. The patient may need inotrope support if no improvement in renal function.  # Hyponatremia, hypervolemic: Check urine sodium, osmolality.  Received diuretics earlier today.  Monitor lab.  May need tolvaptan.  # Metabolic acidosis: On sodium bicarbonate.  Follow labs.  # Acute hypoxic respiratory failure due to CHF: Diuresis now.  Currently on room air.  # Anemia: Hemoglobin around 9-10 mostly.  Currently on heparin.  Watch for any sign of bleeding.  # Hypotension: Currently on midodrine, monitor BP  with diuretics.  Avoid hypotensive episode.  Thank you for the consult.  We will continue to follow.  Jaycie Kregel Jaynie Collins 11/30/2022, 4:00 PM  [employer].

## 2022-11-30 NOTE — H&P (View-Only) (Signed)
Patient Name: Debra Grimes Date of Encounter: 11/30/2022 [facility] Cardiologist: None   Interval Summary  .    Patient is very hard of hearing. She denies being in pain, specifically denies chest pain. Reports that her breathing feels normal. Was weaned down from 4>2 L of supplemental oxygen this AM   Vital Signs .    Vitals:   11/29/22 2339 11/29/22 2356 11/30/22 0322 11/30/22 0746  BP: (!) 81/57 98/65 99/62  (!) 99/56  Pulse: 65 78 66 71  Resp: 16  16 16   Temp: 97.7 F (36.5 C)  97.9 F (36.6 C) 98.4 F (36.9 C)  TempSrc: Axillary  Oral Oral  SpO2: 98% 99% 99% 97%  Weight:   75.6 kg   Height:        Intake/Output Summary (Last 24 hours) at 11/30/2022 0828 Last data filed at 11/30/2022 0442 Gross per 24 hour  Intake 511.63 ml  Output 550 ml  Net -38.37 ml      11/30/2022    3:22 AM 11/29/2022    3:32 AM 11/28/2022    3:56 AM  Last 3 Weights  Weight (lbs) 166 lb 10.7 oz 166 lb 7.2 oz 164 lb 0.4 oz  Weight (kg) 75.6 kg 75.5 kg 74.4 kg      Telemetry/ECG    NSR, HR in the 70s - Personally Reviewed  Physical Exam .   GEN: No acute distress.  Sitting upright in the bed  Neck: No JVD Cardiac: RRR, no murmurs, rubs, or gallops.  Respiratory: Crackles in bilateral lung bases, normal work of breathing on [employer]  GI: Soft, nontender, non-distended  MS: Trace edema in BLE  Assessment & Plan .     NSTEMI Acute Systolic Heart Failure  Ischemic Cardiomyopathy  Severe MR  - Patient presented to [facility] ED with dyspnea on exertion, exertional fatigue, intermittent epigastric discomfort. CXR showed cardiomegaly with small bilateral pleural effusions and interstitial edema. BNP elevated to 1369. hsTn 1849, 1450, 1684, 1889, 1425, 1433  - Echocardiogram from 11/16 showed EF 25-30% with regional wall motion abnormalities, mild LVH, mildly reduced RV function, moderately elevated pulmonary artery systolic pressure, moderate-severe MR - Patient has been on  IV lasix 40 mg BID- there is some concern for oliguria. Note, some doses of lasix have been held due to low BP. Creatinine up to 1.75 this AM.  - Hold lasix today with worsening kidney function. She continues to have crackles in lung bases, but oxygen requirement improved from 4 to 2 L this AM   - Patient has a poor prognosis- has reduced EF, MR, oliguria, AKI, NSTEMI, hypotension. Considering catheterization, but this is dependent on kidney function. Cannot proceed with LHC today with kidney function, could consider RHC to help assess volume status and cause of AKI, cardiac output  - Not on GDMT due to low BP. On midodrine 10 mg BID for BP support  - Continue IV heparin   Atrial Fibrillation, new onset  - Patient developed atrial fibrillation yesterday and was started on IV amiodarone. Has been maintaining NSR since yesterday afternoon - Continue IV heparin  - continue IV amiodarone for now to facilitate load  Otherwise per primary  - Hypervolemic Hyponatremia  - AKI - possible cardiorenal, could also be due to poor perfusion  - Hyperkalemia  - Type 2 DM  - Anemia of chronic disease    For questions or updates, please contact [facility] Please consult [email or web address] for contact info under  Signed, Jonita Albee, PA-C   Patient seen and examined with KJ PA-C.  Agree as above, with the following exceptions and changes as noted below. She does feel better but her EF is low, cr rising, and hypotensive and lasix is intermittently held as a result. Went into afib yesterday, on heparin now on amiodarone . Gen: NAD, CV: iRRR, 1/6 systolic murmur, Lungs: clear, Abd: soft, Extrem: Warm, no edema, Neuro/Psych: alert and oriented x 3, normal mood and affect. All available labs, radiology testing, previous records reviewed. I have consulted [facility]. She likely needs RHC to guide next steps, she may need milrinone to facilitate diuresis. Also needs coronary angiography but this has  been limited due to weekend schedule and now AKI. Hyponatremic, but not clearly due to hypervolemia. MR murmur better today, may have improved with some diuresis. UOP modest.   Parke Poisson, MD 11/30/22 12:03 PM

## 2022-11-30 NOTE — Progress Notes (Signed)
   Palliative Medicine Inpatient Follow Up Note  HPI: Debra Grimes is a 82 y.o. female with medical history significant of diabetes, hypertension, hyperlipidemia presents with several days of shortness of breath and generally feeling fairly poor, with bilateral lower extremity edema. She was admitted for NSTEMI and acute CHF. Palliative care consulted to support additional goals of care conversations.   Today's Discussion 11/30/2022  *Please note that this is a verbal dictation therefore any spelling or grammatical errors are due to the "Dragon Medical One" system interpretation.  Chart reviewed inclusive of vital signs, progress notes, laboratory results, and diagnostic images.   I met with Debra Grimes at bedside this morning. She was awake and alert. She denied pain this morning or nausea. She shares that she feels "ok". She is aware that the plan will depend on what the cardiology team decides in terms of catheterization.   Created space and opportunity for patient to explore thoughts feelings and fears regarding current medical situation. She is aware that her health is in a poor state. She remains hopeful for possible improvements. _____________ Addendum:  I met this afternoon with patients daughter, Debra Grimes, son in law, and granddaughter. We discussed the plan for right heart cath today. We reviewed that patient is in a tenuous state as noted by her drop in blood pressures.  We reviewed the importance of getting more information to better assert goals of care conversations moving forward.   Plan for advanced heart failure team involvement.   Questions and concerns addressed/Palliative Support Provided.   Objective Assessment: Vital Signs Vitals:   11/30/22 0844 11/30/22 1131  BP: 102/62 106/60  Pulse: 72 73  Resp:  16  Temp:  98.1 F (36.7 C)  SpO2: 98% 96%    Intake/Output Summary (Last 24 hours) at 11/30/2022 1231 Last data filed at 11/30/2022 1610 Gross per 24 hour   Intake 511.63 ml  Output --  Net 511.63 ml   Last Weight  Most recent update: 11/30/2022  3:25 AM    Weight  75.6 kg (166 lb 10.7 oz)            Gen:  Elderly Caucasian F in NAD HEENT: moist mucous membranes CV: Irregular rate and rhythm  PULM:  On 3LPM [place], breathing is nonlabored ABD: soft/nontender/nondistended  EXT: (+)1 BLE  edema  Neuro: Alert and oriented x3 - hard of hearing  SUMMARY OF RECOMMENDATIONS   DNAR/DNI   Continue current care allowing time for outcomes   Advanced HF team now involved --> R heart cath today   Patients goals are for improvement in function   Ongoing PMT support  Billing based on MDM: High ______________________________________________________________________________________ Lamarr Lulas [place] Palliative Medicine Team Team Cell Phone: [phone] Please utilize secure chat with additional questions, if there is no response within 30 minutes please call the above phone number  Palliative Medicine Team providers are available by phone from 7am to 7pm daily and can be reached through the team cell phone.  Should this patient require assistance outside of these hours, please call the patient's attending physician.

## 2022-11-30 NOTE — Progress Notes (Signed)
[facility]                                                                               Debra Grimes, is a 82 y.o. female, DOB - 1940-11-13, NFA:[id number] Admit date - 11/27/2022    Outpatient Primary MD for the patient is [facility]  LOS - 3  days    Brief summary    Debra Grimes is a 82 y.o. female with medical history significant of diabetes, hypertension, hyperlipidemia presents with several days of shortness of breath and generally feeling fairly poor, with bilateral lower extremity edema.  She was admitted for NSTEMI and acute CHF.   Assessment & Plan    Assessment and Plan:   Acute respiratory failure with hypoxia secondary to NSTEMI AND Acute systolic CHF/ Ischemic cardiomyopathy/ Severe MR on echocardiogram:  She was started on  IV heparin and IV lasix 40 mg BID, she missed yesterday dose and this morning dose of IV lasix due to hypotension. On Midodrine.  Only about 550 ml urine output since in the last 24 hours.  Daily weights and strict intake and output. Echocardiogram done and reviewed with family.  Cardiology on board, awaiting recommendations.  Currently on 2lit of [place] oxygen.     Acute hypervolemic hyponatremia:  Sodium still hovering around 120's BMP every 12 hours.  TSH wnl. Serum osmo is 275, urine sodium is 54 , cortisol level 12.1, Urine osmolality is 355.  New onset of atrial fibrillation In NSR.  With soft BP parameters.  Rate control with amiodarone gtt.   AKI;  Suspect cardio renal syndrome rather than AKI from diuresis.  Metabolic acidosis non anion gap.  Nephrology consulted for recommendations.      Hyperkalemia:  Resolved with lokelma.  Repeat K WNL.    Type 2 DM;  With hyperglycemia CBG (last 3)  Recent Labs    11/29/22 1616 11/29/22 2052 11/30/22 0743  GLUCAP 166* 162* 121*   Resume SSI for now.  Gabapentin for diabetic neuropathy.  Hemoglobin A1c is 6.8%  GERD On IV Ppi BID.     Anemia of chronic disease Hemoglobin around 9.  Stool for occult blood is pending.    Hypertension:  BP parameters are borderline low.    Hyperlipidemia:  Resume statin.   In view of her age, and multiple medical problems, and new ischemic cardiomyopathy, she has poor prognosis. Requested palliative care consult.  Ongoing discussions.   Estimated body mass index is 30.48 kg/m as calculated from the following:   Height as of this encounter: 5\' 2"  (1.575 m).   Weight as of this encounter: 75.6 kg.  Code Status: DNR LImited. DVT Prophylaxis:  Heparin.    Level of Care: Level of care: Progressive Family Communication: None at bedside.   Disposition Plan:     Remains inpatient appropriate:  not medically stable.   Procedures:  Echocardiogram.  Consultants:   Cardiology Palliative care.  Nephrology.   Antimicrobials:   Anti-infectives (From admission, onward)    None        Medications  Scheduled Meds:  furosemide  40 mg Intravenous Once   gabapentin  300 mg Oral  TID   insulin aspart  0-15 Units Subcutaneous TID WC   insulin aspart  0-5 Units Subcutaneous QHS   midodrine  10 mg Oral BID WC   pantoprazole (PROTONIX) IV  40 mg Intravenous BID   simvastatin  40 mg Oral QPM   sodium bicarbonate  1,300 mg Oral BID   Continuous Infusions:  amiodarone 30 mg/hr (11/30/22 0714)   heparin 1,000 Units/hr (11/30/22 0442)   PRN Meds:.acetaminophen **OR** acetaminophen, guaiFENesin-dextromethorphan, ondansetron **OR** ondansetron (ZOFRAN) IV, polyethylene glycol, prochlorperazine    Subjective:   [name] was seen and examined today.  Feeling better, oxygen weaned down to 2lit/min. Wants to eat.    Objective:   Vitals:   11/29/22 2356 11/30/22 0322 11/30/22 0746 11/30/22 0844  BP: 98/65 99/62 (!) 99/56 102/62  Pulse: 78 66 71 72  Resp:  16 16   Temp:  97.9 F (36.6 C) 98.4 F (36.9 C)   TempSrc:  Oral Oral   SpO2: 99% 99% 97% 98%  Weight:   75.6 kg    Height:        Intake/Output Summary (Last 24 hours) at 11/30/2022 1048 Last data filed at 11/30/2022 0442 Gross per 24 hour  Intake 511.63 ml  Output 550 ml  Net -38.37 ml   Filed Weights   11/28/22 0356 11/29/22 0332 11/30/22 0322  Weight: 74.4 kg 75.5 kg 75.6 kg     Exam General exam: Ill appearing elderly female , not in distress on 2lit of [place] oxygen.  Respiratory system: diminished at bases, no wheezing heard.  Cardiovascular system: S1 & S2 heard, RRR.  Gastrointestinal system: Abdomen is nondistended, soft and nontender.  Central nervous system: Alert and oriented. Extremities: improving pedal edema.  Skin: No rashes,  Psychiatry: mood is appropriate.     Data Reviewed:  I have personally reviewed following labs and imaging studies   CBC Lab Results  Component Value Date   WBC 8.6 11/30/2022   RBC 2.98 (L) 11/30/2022   HGB 8.9 (L) 11/30/2022   HCT 26.8 (L) 11/30/2022   MCV 89.9 11/30/2022   MCH 29.9 11/30/2022   PLT 236 11/30/2022   MCHC 33.2 11/30/2022   RDW 14.1 11/30/2022   LYMPHSABS 1.7 08/21/2014   MONOABS 0.5 08/21/2014   EOSABS 0.2 08/21/2014   BASOSABS 0.0 08/21/2014     Last metabolic panel Lab Results  Component Value Date   NA 121 (L) 11/30/2022   K 4.1 11/30/2022   CL 92 (L) 11/30/2022   CO2 19 (L) 11/30/2022   BUN 37 (H) 11/30/2022   CREATININE 1.75 (H) 11/30/2022   GLUCOSE 127 (H) 11/30/2022   GFRNONAA 29 (L) 11/30/2022   GFRAA >60 12/25/2014   CALCIUM 8.5 (L) 11/30/2022   PROT 7.1 11/27/2022   ALBUMIN 3.5 11/27/2022   BILITOT 0.7 11/27/2022   ALKPHOS 42 11/27/2022   AST 20 11/27/2022   ALT 17 11/27/2022   ANIONGAP 10 11/30/2022    CBG (last 3)  Recent Labs    11/29/22 1616 11/29/22 2052 11/30/22 0743  GLUCAP 166* 162* 121*      Coagulation Profile: No results for input(s): "INR", "PROTIME" in the last 168 hours.   Radiology Studies: No results found.     Kathlen Mody M.D. [facility] 11/30/2022, 10:48 AM  Available via Epic secure chat 7am-7pm After 7 pm, please refer to night coverage provider listed on amion.

## 2022-11-30 NOTE — Progress Notes (Signed)
   Heart Failure Stewardship Pharmacist Progress Note   PCP: [facility] PCP-Cardiologist: None    HPI:  82 yo F with PMH of HTN, HLD, and T2DM.   Presented to the ED on 11/15 with LE edema and shortness of breath. CXR with mild cardiomegaly, vascular congestion, and edema. BNP 1369, hs trop 1849. Admitted for CHF exacerbation and NSTEMI. ECHO on 11/16 showed LVEF 25-30%, RWMA, mild LVH, RV mildly reduced. Hospitalization complicated by hypotension requiring midodrine, AKI, and oliguria.  Current HF Medications: Diuretic: furosemide 40 mg IV x 1 (11/17) *on midodrine 10 mg BID  Prior to admission HF Medications: ACE/ARB/ARNI: losartan 100 mg daily *also on amlodipine 5 mg daily  Pertinent Lab Values: Serum creatinine 1.75, BUN 37, Potassium 4.1, Sodium 121, BNP 1369.0, A1c 6.8  Vital Signs: Weight: 166 lbs (admission weight: 167 lbs) Blood pressure: 100/60s  Heart rate: 60-70s  I/O: incomplete  Medication Assistance / Insurance Benefits Check: Does the patient have prescription insurance?  Yes Type of insurance plan: [facility] Medicare  Outpatient Pharmacy:  Prior to admission outpatient pharmacy: [facility] Is the patient willing to use [facility] TOC pharmacy at discharge? Yes Is the patient willing to transition their outpatient pharmacy to utilize a [facility] outpatient pharmacy?   No    Assessment: 1. Acute systolic CHF (LVEF 25-30%), pending ischemic evaluation once renal function stable. NYHA class III symptoms. - Holding furosemide with AKI and intermittent hypotension. One time order for IV lasix due yesterday. Will remove order. Consider restarting lasix today (one dose vs BID dosing pending response). May be able to go for RHC today. Suspect possible low output with hypotension, worsening renal function, and bicarb 19. - GDMT limited by hypotension requiring midodrine 10 mg BID and hyponatremia  Plan: 1) Medication changes recommended at this  time: - Remove one time dose for furosemide on 11/17 to clean up MAR - Restart IV lasix  - Follow up RHC today, may need AHF consult  2) Patient assistance: - None pending  3)  Education  - Initial education completed - Full education to be completed prior to discharge  Sharen Hones, PharmD, BCPS Heart Failure Engineer, building services Phone [phone]

## 2022-11-30 NOTE — Interval H&P Note (Signed)
History and Physical Interval Note:  11/30/2022 2:59 PM  Debra Grimes  has presented today for surgery, with the diagnosis of heart failure.  The various methods of treatment have been discussed with the patient and family. After consideration of risks, benefits and other options for treatment, the patient has consented to  Procedure(s): RIGHT HEART CATH (N/A) as a surgical intervention.  The patient's history has been reviewed, patient examined, no change in status, stable for surgery.  I have reviewed the patient's chart and labs.  Questions were answered to the patient's satisfaction.     Romie Minus

## 2022-11-30 NOTE — Progress Notes (Signed)
PHARMACY - ANTICOAGULATION CONSULT NOTE  Pharmacy Consult for heparin Indication: chest pain/ACS  Allergies  Allergen Reactions   Hydrocodone    Oxycodone-Acetaminophen    Penicillins     Patient Measurements: Height: 5\' 2"  (157.5 cm) Weight: 75.6 kg (166 lb 10.7 oz) IBW/kg (Calculated) : 50.1 Heparin Dosing Weight: 68.7kg  Vital Signs: Temp: 98.1 F (36.7 C) (11/18 1131) Temp Source: Oral (11/18 1131) BP: 106/60 (11/18 1131) Pulse Rate: 73 (11/18 1131)  Labs: Recent Labs    11/28/22 0136 11/28/22 0314 11/28/22 0315 11/28/22 0719 11/28/22 0831 11/28/22 1211 11/28/22 2049 11/29/22 0555 11/29/22 1800 11/30/22 0737  HGB  --   --  9.2*  --   --   --   --  8.9*  --  8.9*  HCT  --   --  27.8*  --   --   --   --  26.9*  --  26.8*  PLT  --   --  228  --   --   --   --  230  --  236  HEPARINUNFRC 0.23*   < >  --   --   --    < > 0.36 0.56  --  0.61  CREATININE  --    < >  --  1.19* 1.34*   < >  --  1.35* 1.51* 1.75*  TROPONINIHS 1,889*  --   --  1,425* 1,433*  --   --   --   --   --    < > = values in this interval not displayed.    Estimated Creatinine Clearance: 23.2 mL/min (A) (by C-G formula based on SCr of 1.75 mg/dL (H)).   Medical History: Past Medical History:  Diagnosis Date   Arthritis    Hearing loss    Hypercholesteremia    Hypertension    Macular degeneration    Type 2 diabetes mellitus (HCC)    Assessment: 82 year old female admitted to [facility] with leg swelling and shortness of breath. Cardiac enzymes elevated. New orders to start IV heparin for rule out ACS. Patient does not appear to be on anticoagulation prior to admit.   Heparin level 0.61 therapeutic on 1000 units/hr. Hgb 8.9, plt wnl. No signs of bleeding noted.   Goal of Therapy:  Heparin level 0.3-0.7 units/ml Monitor platelets by anticoagulation protocol: Yes   Plan:  Continue heparin 1000 units/hr  Daily heparin level and CBC  Monitor for s/sx of bleeding   Thank you for involving  pharmacy in the patient's care.   Trixie Rude, PharmD Clinical Pharmacist 11/30/2022  12:49 PM

## 2022-11-30 NOTE — Plan of Care (Signed)
  Problem: Education: Goal: Knowledge of General Education information will improve Description: Including pain rating scale, medication(s)/side effects and non-pharmacologic comfort measures 11/30/2022 1921 by Verita Lamb, RN Outcome: Progressing 11/30/2022 1920 by Verita Lamb, RN Outcome: Progressing   Problem: Health Behavior/Discharge Planning: Goal: Ability to manage health-related needs will improve Outcome: Progressing   Problem: Clinical Measurements: Goal: Ability to maintain clinical measurements within normal limits will improve 11/30/2022 1921 by Verita Lamb, RN Outcome: Progressing 11/30/2022 1920 by Verita Lamb, RN Outcome: Progressing Goal: Will remain free from infection 11/30/2022 1921 by Verita Lamb, RN Outcome: Progressing 11/30/2022 1920 by Verita Lamb, RN Outcome: Progressing Goal: Diagnostic test results will improve 11/30/2022 1921 by Verita Lamb, RN Outcome: Progressing 11/30/2022 1920 by Verita Lamb, RN Outcome: Progressing Goal: Cardiovascular complication will be avoided Outcome: Progressing   Problem: Activity: Goal: Risk for activity intolerance will decrease 11/30/2022 1921 by Verita Lamb, RN Outcome: Progressing 11/30/2022 1920 by Verita Lamb, RN Outcome: Progressing   Problem: Nutrition: Goal: Adequate nutrition will be maintained 11/30/2022 1921 by Verita Lamb, RN Outcome: Progressing 11/30/2022 1920 by Verita Lamb, RN Outcome: Progressing   Problem: Coping: Goal: Level of anxiety will decrease 11/30/2022 1921 by Verita Lamb, RN Outcome: Progressing 11/30/2022 1920 by Verita Lamb, RN Outcome: Adequate for Discharge   Problem: Pain Management: Goal: General experience of comfort will improve 11/30/2022 1921 by Verita Lamb, RN Outcome: Progressing 11/30/2022 1920 by Verita Lamb, RN Outcome: Progressing   Problem:  Safety: Goal: Ability to remain free from injury will improve Outcome: Progressing   Problem: Skin Integrity: Goal: Risk for impaired skin integrity will decrease 11/30/2022 1921 by Verita Lamb, RN Outcome: Progressing 11/30/2022 1920 by Verita Lamb, RN Outcome: Progressing

## 2022-11-30 NOTE — TOC Initial Note (Signed)
Transition of Care [facility]) - Initial/Assessment Note    Patient Details  Name: Debra Grimes MRN: [id number] Date of Birth: 1940/08/07  [facility]) CM/SW Contact:    Nicanor Bake Phone Number: [phone] 11/30/2022, 3:23 PM  Clinical Narrative:  HF CSW met with pt at bedside. Pt stated that she lives alone. However, she has two daughters who live really close to her who come and assist her often. Pt stated that she has a history of HH services and had PT/OT coming to the house. Pt stated that she does not use any equipment and was quite independent before this hospitalization. CSW stated that a hospital follow up appointment will be scheduled closer to dc. Pt agrees.   TOC will continue following.                  Expected Discharge Plan: Home/Self Care Barriers to Discharge: Continued Medical Work up   Patient Goals and CMS Choice            Expected Discharge Plan and Services       Living arrangements for the past 2 months: Single Family Home                                      Prior Living Arrangements/Services Living arrangements for the past 2 months: Single Family Home Lives with:: Self Patient language and need for interpreter reviewed:: Yes Do you feel safe going back to the place where you live?: Yes      Need for Family Participation in Patient Care: No (Comment) Care giver support system in place?: No (comment)   Criminal Activity/Legal Involvement Pertinent to Current Situation/Hospitalization: No - Comment as needed  Activities of Daily Living   ADL Screening (condition at time of admission) Independently performs ADLs?: Yes (appropriate for developmental age) Is the patient deaf or have difficulty hearing?: Yes Does the patient have difficulty seeing, even when wearing glasses/contacts?: No Does the patient have difficulty concentrating, remembering, or making decisions?: No  Permission Sought/Granted                   Emotional Assessment Appearance:: Appears stated age Attitude/Demeanor/Rapport: Engaged Affect (typically observed): Appropriate Orientation: : Oriented to Situation, Oriented to  Time, Oriented to Place, Oriented to Self Alcohol / Substance Use: Not Applicable Psych Involvement: No (comment)  Admission diagnosis:  NSTEMI (non-ST elevated myocardial infarction) [facility]) [I21.4] Patient Active Problem List   Diagnosis Date Noted   Acute clinical systolic heart failure (HCC) 11/27/2022   NSTEMI (non-ST elevated myocardial infarction) (HCC) 11/27/2022   Hyponatremia 11/27/2022   Metabolic acidosis 11/27/2022   Hypertension    Hypercholesteremia    CLOSED FRACTURE OF PATELLA 05/10/2008   Diabetes mellitus without complication (HCC) 05/09/2008   PCP:  [facility] Pharmacy:   [facility] - [place], [place] - [address] [place] [address] Phone: [phone] Fax: [phone]  [facility] [address] [place] [address] Phone: [phone] Fax: [phone]     Social Determinants of Health (SDOH) Social History: SDOH Screenings   Food Insecurity: No Food Insecurity (11/27/2022)  Housing: Low Risk  (11/27/2022)  Transportation Needs: No Transportation Needs (11/27/2022)  Utilities: Not At Risk (11/27/2022)  Tobacco Use: Low Risk  (11/27/2022)   SDOH Interventions:     Readmission Risk Interventions  No data to display

## 2022-11-30 NOTE — Progress Notes (Signed)
Heart Failure Navigator Progress Note  Assessed for Heart & Vascular TOC clinic readiness.  Patient does not meet criteria due to Advanced Heart Failure Team consulted. .   Navigator will sign off at this time.   Dawn Fields, BSN, RN Heart Failure Nurse Navigator Secure Chat Only   

## 2022-11-30 NOTE — Progress Notes (Addendum)
Patient Name: Debra Grimes Date of Encounter: 11/30/2022 [facility] Cardiologist: None   Interval Summary  .    Patient is very hard of hearing. She denies being in pain, specifically denies chest pain. Reports that her breathing feels normal. Was weaned down from 4>2 L of supplemental oxygen this AM   Vital Signs .    Vitals:   11/29/22 2339 11/29/22 2356 11/30/22 0322 11/30/22 0746  BP: (!) 81/57 98/65 99/62  (!) 99/56  Pulse: 65 78 66 71  Resp: 16  16 16   Temp: 97.7 F (36.5 C)  97.9 F (36.6 C) 98.4 F (36.9 C)  TempSrc: Axillary  Oral Oral  SpO2: 98% 99% 99% 97%  Weight:   75.6 kg   Height:        Intake/Output Summary (Last 24 hours) at 11/30/2022 0828 Last data filed at 11/30/2022 0442 Gross per 24 hour  Intake 511.63 ml  Output 550 ml  Net -38.37 ml      11/30/2022    3:22 AM 11/29/2022    3:32 AM 11/28/2022    3:56 AM  Last 3 Weights  Weight (lbs) 166 lb 10.7 oz 166 lb 7.2 oz 164 lb 0.4 oz  Weight (kg) 75.6 kg 75.5 kg 74.4 kg      Telemetry/ECG    NSR, HR in the 70s - Personally Reviewed  Physical Exam .   GEN: No acute distress.  Sitting upright in the bed  Neck: No JVD Cardiac: RRR, no murmurs, rubs, or gallops.  Respiratory: Crackles in bilateral lung bases, normal work of breathing on [place]  GI: Soft, nontender, non-distended  MS: Trace edema in BLE  Assessment & Plan .     NSTEMI Acute Systolic Heart Failure  Ischemic Cardiomyopathy  Severe MR  - Patient presented to [facility] ED with dyspnea on exertion, exertional fatigue, intermittent epigastric discomfort. CXR showed cardiomegaly with small bilateral pleural effusions and interstitial edema. BNP elevated to 1369. hsTn 1849, 1450, 1684, 1889, 1425, 1433  - Echocardiogram from 11/16 showed EF 25-30% with regional wall motion abnormalities, mild LVH, mildly reduced RV function, moderately elevated pulmonary artery systolic pressure, moderate-severe MR - Patient has been on  IV lasix 40 mg BID- there is some concern for oliguria. Note, some doses of lasix have been held due to low BP. Creatinine up to 1.75 this AM.  - Hold lasix today with worsening kidney function. She continues to have crackles in lung bases, but oxygen requirement improved from 4 to 2 L this AM   - Patient has a poor prognosis- has reduced EF, MR, oliguria, AKI, NSTEMI, hypotension. Considering catheterization, but this is dependent on kidney function. Cannot proceed with LHC today with kidney function, could consider RHC to help assess volume status and cause of AKI, cardiac output  - Not on GDMT due to low BP. On midodrine 10 mg BID for BP support  - Continue IV heparin   Atrial Fibrillation, new onset  - Patient developed atrial fibrillation yesterday and was started on IV amiodarone. Has been maintaining NSR since yesterday afternoon - Continue IV heparin  - continue IV amiodarone for now to facilitate load  Otherwise per primary  - Hypervolemic Hyponatremia  - AKI - possible cardiorenal, could also be due to poor perfusion  - Hyperkalemia  - Type 2 DM  - Anemia of chronic disease    For questions or updates, please contact [facility] Please consult [email or web address] for contact info under  Signed, Jonita Albee, PA-C   Patient seen and examined with KJ PA-C.  Agree as above, with the following exceptions and changes as noted below. She does feel better but her EF is low, cr rising, and hypotensive and lasix is intermittently held as a result. Went into afib yesterday, on heparin now on amiodarone . Gen: NAD, CV: iRRR, 1/6 systolic murmur, Lungs: clear, Abd: soft, Extrem: Warm, no edema, Neuro/Psych: alert and oriented x 3, normal mood and affect. All available labs, radiology testing, previous records reviewed. I have consulted AHF. She likely needs RHC to guide next steps, she may need milrinone to facilitate diuresis. Also needs coronary angiography but this has  been limited due to weekend schedule and now AKI. Hyponatremic, but not clearly due to hypervolemia. MR murmur better today, may have improved with some diuresis. UOP modest.   Parke Poisson, MD 11/30/22 12:03 PM

## 2022-12-01 ENCOUNTER — Encounter (HOSPITAL_COMMUNITY): Payer: Self-pay | Admitting: Cardiology

## 2022-12-01 ENCOUNTER — Other Ambulatory Visit (HOSPITAL_COMMUNITY): Payer: Self-pay

## 2022-12-01 ENCOUNTER — Telehealth (HOSPITAL_COMMUNITY): Payer: Self-pay | Admitting: Pharmacy Technician

## 2022-12-01 DIAGNOSIS — I214 Non-ST elevation (NSTEMI) myocardial infarction: Principal | ICD-10-CM

## 2022-12-01 DIAGNOSIS — Z515 Encounter for palliative care: Secondary | ICD-10-CM | POA: Diagnosis not present

## 2022-12-01 DIAGNOSIS — E119 Type 2 diabetes mellitus without complications: Secondary | ICD-10-CM | POA: Diagnosis not present

## 2022-12-01 DIAGNOSIS — Z7189 Other specified counseling: Secondary | ICD-10-CM | POA: Diagnosis not present

## 2022-12-01 DIAGNOSIS — E7800 Pure hypercholesterolemia, unspecified: Secondary | ICD-10-CM | POA: Diagnosis not present

## 2022-12-01 DIAGNOSIS — I5021 Acute systolic (congestive) heart failure: Secondary | ICD-10-CM | POA: Diagnosis not present

## 2022-12-01 LAB — CBC
HCT: 26.1 % — ABNORMAL LOW (ref 36.0–46.0)
Hemoglobin: 8.8 g/dL — ABNORMAL LOW (ref 12.0–15.0)
MCH: 30.1 pg (ref 26.0–34.0)
MCHC: 33.7 g/dL (ref 30.0–36.0)
MCV: 89.4 fL (ref 80.0–100.0)
Platelets: 209 10*3/uL (ref 150–400)
RBC: 2.92 MIL/uL — ABNORMAL LOW (ref 3.87–5.11)
RDW: 14.1 % (ref 11.5–15.5)
WBC: 7.5 10*3/uL (ref 4.0–10.5)
nRBC: 0 % (ref 0.0–0.2)

## 2022-12-01 LAB — ALBUMIN: Albumin: 2.7 g/dL — ABNORMAL LOW (ref 3.5–5.0)

## 2022-12-01 LAB — BASIC METABOLIC PANEL
Anion gap: 13 (ref 5–15)
Anion gap: 12 (ref 5–15)
BUN: 34 mg/dL — ABNORMAL HIGH (ref 8–23)
BUN: 40 mg/dL — ABNORMAL HIGH (ref 8–23)
CO2: 19 mmol/L — ABNORMAL LOW (ref 22–32)
CO2: 20 mmol/L — ABNORMAL LOW (ref 22–32)
Calcium: 8.1 mg/dL — ABNORMAL LOW (ref 8.9–10.3)
Calcium: 8.3 mg/dL — ABNORMAL LOW (ref 8.9–10.3)
Chloride: 92 mmol/L — ABNORMAL LOW (ref 98–111)
Chloride: 89 mmol/L — ABNORMAL LOW (ref 98–111)
Creatinine, Ser: 1.44 mg/dL — ABNORMAL HIGH (ref 0.44–1.00)
Creatinine, Ser: 1.79 mg/dL — ABNORMAL HIGH (ref 0.44–1.00)
GFR, Estimated: 36 mL/min — ABNORMAL LOW (ref >60)
GFR, Estimated: 28 mL/min — ABNORMAL LOW (ref >60)
Glucose, Bld: 114 mg/dL — ABNORMAL HIGH (ref 70–99)
Glucose, Bld: 241 mg/dL — ABNORMAL HIGH (ref 70–99)
Potassium: 4.1 mmol/L (ref 3.5–5.1)
Potassium: 4.1 mmol/L (ref 3.5–5.1)
Sodium: 124 mmol/L — ABNORMAL LOW (ref 135–145)
Sodium: 121 mmol/L — ABNORMAL LOW (ref 135–145)

## 2022-12-01 LAB — GLUCOSE, CAPILLARY
Glucose-Capillary: 120 mg/dL — ABNORMAL HIGH (ref 70–99)
Glucose-Capillary: 172 mg/dL — ABNORMAL HIGH (ref 70–99)
Glucose-Capillary: 183 mg/dL — ABNORMAL HIGH (ref 70–99)
Glucose-Capillary: 221 mg/dL — ABNORMAL HIGH (ref 70–99)

## 2022-12-01 LAB — HEPARIN LEVEL (UNFRACTIONATED): Heparin Unfractionated: 0.34 [IU]/mL (ref 0.30–0.70)

## 2022-12-01 LAB — MAGNESIUM: Magnesium: 1.7 mg/dL (ref 1.7–2.4)

## 2022-12-01 MED ORDER — MIDODRINE HCL 5 MG PO TABS
10.0000 mg | ORAL_TABLET | Freq: Three times a day (TID) | ORAL | Status: DC
  Administered 2022-12-01 – 2022-12-04 (×8): 10 mg via ORAL
  Filled 2022-12-01 (×8): qty 2

## 2022-12-01 MED ORDER — PANTOPRAZOLE SODIUM 40 MG PO TBEC
40.0000 mg | DELAYED_RELEASE_TABLET | Freq: Two times a day (BID) | ORAL | Status: DC
  Administered 2022-12-01 – 2022-12-04 (×6): 40 mg via ORAL
  Filled 2022-12-01 (×6): qty 1

## 2022-12-01 MED ORDER — ALBUMIN HUMAN 25 % IV SOLN
25.0000 g | Freq: Four times a day (QID) | INTRAVENOUS | Status: AC
Start: 2022-12-01 — End: 2022-12-02
  Administered 2022-12-01: 12.5 g via INTRAVENOUS
  Administered 2022-12-01 (×2): 25 g via INTRAVENOUS
  Filled 2022-12-01 (×3): qty 100

## 2022-12-01 MED ORDER — CHLORHEXIDINE GLUCONATE CLOTH 2 % EX PADS
6.0000 | MEDICATED_PAD | Freq: Every day | CUTANEOUS | Status: DC
  Administered 2022-12-01 – 2022-12-04 (×4): 6 via TOPICAL

## 2022-12-01 MED ORDER — ACETAZOLAMIDE ER 500 MG PO CP12
500.0000 mg | ORAL_CAPSULE | Freq: Once | ORAL | Status: AC
  Administered 2022-12-01: 500 mg via ORAL
  Filled 2022-12-01: qty 1

## 2022-12-01 NOTE — Progress Notes (Signed)
Mobility Specialist Progress Note:   12/01/22 1549  Oxygen Therapy  O2 Device Room Air  Mobility  Activity Ambulated with assistance in hallway  Level of Assistance Minimal assist, patient does 75% or more  Assistive Device Front wheel walker  Distance Ambulated (ft) 40 ft  Activity Response Tolerated well  Mobility Referral Yes  $Mobility charge 1 Mobility  Mobility Specialist Start Time (ACUTE ONLY) 1513  Mobility Specialist Stop Time (ACUTE ONLY) 1524  Mobility Specialist Time Calculation (min) (ACUTE ONLY) 11 min    Pt received in chair, requesting assistance back to bed. Agreeable to ambulate in hallway. Asymptomatic w/ no complaints. VSS throughout. Pt left ambulating in room w/ OT. RN aware.  D'Vante Earlene Plater Mobility Specialist Please contact via Special educational needs teacher or Rehab office at [phone]

## 2022-12-01 NOTE — Care Management Important Message (Signed)
Important Message  Patient Details  Name: Debra Grimes MRN: [id number] Date of Birth: 03-02-1940   Important Message Given:  Yes - Medicare IM     Sherilyn Banker 12/01/2022, 2:28 PM

## 2022-12-01 NOTE — Progress Notes (Signed)
[facility] NEPHROLOGY PROGRESS NOTE  Assessment/ Plan: Pt is a 82 y.o. yo female with past medical history significant for hypertension, hearing loss, HLD, type II DM, presented to the ER on 11/27/2022 because of peripheral edema and worsening dyspnea, seen as a consultation for the evaluation of AKI.   # Acute kidney injury presumably cardiorenal syndrome with acute systolic CHF concomitant with persistent hypotension with reduced renal perfusion. UA bland, [place] normal. RHC with severely elevated biventricular filling pressure.  Responding with IV Lasix.  May need inotropic support if no improvement.  Continue to monitor daily labs and strict ins and out.   # Hyponatremia, hypervolemic: Urine sodium high in the setting of Lasix over not reliable.  Continue diuretics.  May add tolvaptan in next 1 to 2 days depending on the lab results.   # Metabolic acidosis: On sodium bicarbonate.  Follow labs.   # Acute hypoxic respiratory failure due to CHF: Diuresis now.  Currently on room air.   # Anemia: Hemoglobin around 9-10 mostly.  Currently on heparin.  Watch for any sign of bleeding.   # Hypotension: Persistent hypotension.  She is not needing diuretics to manage volume.  Midodrine 10 mg 3 times daily.  #GOC: Palliative care is following and patient is DNR/DNI.  Continue current management.  I have discussed with the patient's 2 daughters today.  Subjective: Seen and examined at bedside.  Urine output is recorded around 900 cc with diuretics.  Currently on room air.  Denies nausea, vomiting, chest pain or shortness of breath.  Her daughters presented with her at the bedside. Objective Vital signs in last 24 hours: Vitals:   12/01/22 0345 12/01/22 0717 12/01/22 0734 12/01/22 0902  BP: (!) 100/55 (!) 91/51 108/61 (!) 91/51  Pulse: 70 66 73 77  Resp: 16 16    Temp: 97.6 F (36.4 C) 97.7 F (36.5 C)    TempSrc: Oral Oral    SpO2: 95% 98% 96% 99%  Weight: 73.8 kg     Height:        Weight change: -1.8 kg  Intake/Output Summary (Last 24 hours) at 12/01/2022 1119 Last data filed at 12/01/2022 1016 Gross per 24 hour  Intake 300 ml  Output 1525 ml  Net -1225 ml       Labs: RENAL PANEL Recent Labs  Lab 11/27/22 1400 11/27/22 2303 11/29/22 0555 11/29/22 1800 11/30/22 0737 11/30/22 1528 11/30/22 1803 12/01/22 0658  NA 121*   < > 125* 123* 121* 124*  124* 123* 124*  K 4.8   < > 4.3 4.8 4.1 3.9  3.9 4.0 4.1  CL 93*   < > 95* 93* 92*  --  91* 92*  CO2 15*   < > 21* 20* 19*  --  19* 19*  GLUCOSE 160*   < > 75 222* 127*  --  119* 114*  BUN 30*   < > 28* 31* 37*  --  36* 34*  CREATININE 1.27*   < > 1.35* 1.51* 1.75*  --  1.54* 1.44*  CALCIUM 8.6*   < > 8.5* 8.5* 8.5*  --  8.4* 8.1*  MG  --   --   --   --   --   --   --  1.7  ALBUMIN 3.5  --   --   --   --   --   --  2.7*   < > = values in this interval not displayed.    Liver Function  Tests: Recent Labs  Lab 11/27/22 1400 12/01/22 0658  AST 20  --   ALT 17  --   ALKPHOS 42  --   BILITOT 0.7  --   PROT 7.1  --   ALBUMIN 3.5 2.7*   No results for input(s): "LIPASE", "AMYLASE" in the last 168 hours. No results for input(s): "AMMONIA" in the last 168 hours. CBC: Recent Labs    11/28/22 0315 11/29/22 0555 11/30/22 0737 11/30/22 1528 12/01/22 0658  HGB 9.2* 8.9* 8.9* 9.5*  9.5* 8.8*  MCV 89.7 90.6 89.9  --  89.4    Cardiac Enzymes: No results for input(s): "CKTOTAL", "CKMB", "CKMBINDEX", "TROPONINI" in the last 168 hours. CBG: Recent Labs  Lab 11/30/22 0743 11/30/22 1129 11/30/22 1636 11/30/22 2057 12/01/22 0716  GLUCAP 121* 139* 123* 143* 120*    Iron Studies: No results for input(s): "IRON", "TIBC", "TRANSFERRIN", "FERRITIN" in the last 72 hours. Studies/Results: US RENAL  Result Date: 11/30/2022 CLINICAL DATA:  Acute renal injury EXAM: RENAL / URINARY TRACT ULTRASOUND COMPLETE COMPARISON:  None Available. FINDINGS: Right Kidney: Renal measurements: 10.0 x 4.0 x 5.5 cm. =  volume: 114 mL. Echogenicity within normal limits. No mass or hydronephrosis visualized. Left Kidney: Renal measurements: 9.3 x 4.6 x 4.2 cm. = volume: 93 mL. Echogenicity within normal limits. No mass or hydronephrosis visualized. Bladder: Decompressed Other: None. IMPRESSION: No acute abnormality noted. Electronically Signed   By: Alcide Clever M.D.   On: 11/30/2022 21:23   CARDIAC CATHETERIZATION  Result Date: 11/30/2022 HEMODYNAMICS: RA:       19 mmHg (mean) RV:       63/13, 21 mmHg PA:       63/35 mmHg (44 mean) PCWP: 34 mmHg (mean) with v waves to 50    Estimated Fick CO/CI   5.72L/min, 3.23L/min/m2    TPG  10  mmHg     PVR  1.7 Wood Units PAPi  1.47  IMPRESSION: Severely elevated left and right sided filling pressures. 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 Normal cardiac output and index by assumed fick method. SVR of 770 dynes/sec/cm-5. Severe coronary artery calcifications noted on fluoro. RECOMMENDATIONS: Would continue aggressive diuresis. RHC not consistent with low output heart failure. Vasodilatory state with low SVR.    Medications: Infusions:  albumin human Stopped (12/01/22 1114)   amiodarone 30 mg/hr (12/01/22 0631)   heparin 1,000 Units/hr (11/30/22 1754)    Scheduled Medications:  aspirin EC  81 mg Oral Daily   furosemide  80 mg Intravenous BID   gabapentin  300 mg Oral TID   insulin aspart  0-15 Units Subcutaneous TID WC   insulin aspart  0-5 Units Subcutaneous QHS   pantoprazole  40 mg Oral BID   simvastatin  40 mg Oral QPM   sodium bicarbonate  650 mg Oral BID   sodium chloride flush  10 mL Intravenous Q12H    have reviewed scheduled and prn medications.  Physical Exam: General:NAD, comfortable Heart:RRR, s1s2 nl Lungs:clear b/l, no crackle Abdomen:soft, Non-tender, non-distended Extremities: Bilateral peripheral edema+ Neurology: Alert awake, hard of hearing.  Delbert Vu Prasad Exie Chrismer 12/01/2022,11:19 AM  LOS: 4 days

## 2022-12-01 NOTE — Evaluation (Signed)
Occupational Therapy Evaluation Patient Details Name: Debra Grimes MRN: [id number] DOB: 1940-12-25 Today's Date: 12/01/2022   History of Present Illness 82 y.o. female with medical history significant of diabetes, hypertension, hyperlipidemia adm 11/15 with several days of shortness of breath and generally feeling fairly poor, with bilateral lower extremity edema. She was admitted for NSTEMI and acute CHF.   Clinical Impression   Patient admitted for the diagnosis above.  PTA she lived at home alone, daughter's assisted if needed, but generally she remained Ind with ADL,iADL and drove.  Currently she presents with the deficits listed below, needing Min A to stand from lower surfaces, supervision for mobility at RW level, and Min A for lower body ADL from sit to stand level.  OT will continue efforts in the acute setting to address deficits, and HH OT can be considered if the patient agrees.  Family is in the process of determining patient's discharge disposition, home with daughter's taking turns staying with her versus patient staying at daughter's home.         If plan is discharge home, recommend the following: A little help with walking and/or transfers;A little help with bathing/dressing/bathroom;Assistance with cooking/housework    Functional Status Assessment  Patient has had a recent decline in their functional status and demonstrates the ability to make significant improvements in function in a reasonable and predictable amount of time.  Equipment Recommendations  BSC/3in1;Tub/shower bench    Recommendations for Other Services       Precautions / Restrictions Precautions Precautions: Fall Precaution Comments: Watch HR and BP Restrictions Weight Bearing Restrictions: No      Mobility Bed Mobility Overal bed mobility: Needs Assistance Bed Mobility: Sit to Supine       Sit to supine: Supervision        Transfers Overall transfer level: Needs  assistance Equipment used: Rolling walker (2 wheels) Transfers: Sit to/from Stand, Bed to chair/wheelchair/BSC Sit to Stand: Min assist     Step pivot transfers: Supervision, Contact guard assist            Balance Overall balance assessment: Needs assistance Sitting-balance support: Feet supported Sitting balance-Leahy Scale: Good     Standing balance support: Reliant on assistive device for balance Standing balance-Leahy Scale: Fair Standing balance comment: can static stand without RW                           ADL either performed or assessed with clinical judgement   ADL       Grooming: Wash/dry hands;Standing;Supervision/safety               Lower Body Dressing: Contact guard assist;Minimal assistance;Sit to/from stand   Toilet Transfer: Ambulance person;Ambulation Toilet Transfer Details (indicate cue type and reason): Min A for sit to stand from lower surface.                 Vision Patient Visual Report: No change from baseline       Perception Perception: Within Functional Limits       Praxis Praxis: WFL       Pertinent Vitals/Pain Pain Assessment Pain Assessment: No/denies pain     Extremity/Trunk Assessment Upper Extremity Assessment Upper Extremity Assessment: Generalized weakness   Lower Extremity Assessment Lower Extremity Assessment: Overall WFL for tasks assessed   Cervical / Trunk Assessment Cervical / Trunk Assessment: Kyphotic   Communication Communication Communication: Hearing impairment   Cognition Arousal: Alert Behavior During Therapy:  WFL for tasks assessed/performed Overall Cognitive Status: Within Functional Limits for tasks assessed                                       General Comments   O2 92% on RA.      Exercises     Shoulder Instructions      Home Living Family/patient expects to be discharged to:: Private residence Living Arrangements:  Alone Available Help at Discharge: Family;Available PRN/intermittently Type of Home: House Home Access: Stairs to enter [employer] of Steps: 2 Entrance Stairs-Rails: Left Home Layout: One level     Bathroom Shower/Tub: Chief Strategy Officer: Standard Bathroom Accessibility: Yes How Accessible: Accessible via walker     Additional Comments: Has an old 3n1 and RW from spouse.  Family unsure of condition.      Prior Functioning/Environment Prior Level of Function : Independent/Modified Independent;Driving             Mobility Comments: Not using AD for mobility, enjoyed gardening ADLs Comments: No assist with ADL, iADL.  Able to managem meds and continued to drive up until a few weeks ago.        OT Problem List: Decreased strength;Decreased activity tolerance;Impaired balance (sitting and/or standing)      OT Treatment/Interventions: Self-care/ADL training;Therapeutic activities;Patient/family education;DME and/or AE instruction;Balance training    OT Goals(Current goals can be found in the care plan section) Acute Rehab OT Goals Patient Stated Goal: Return home with family assist PRN OT Goal Formulation: With patient Time For Goal Achievement: 01/08/2023 Potential to Achieve Goals: Good ADL Goals Pt Will Perform Grooming: with modified independence;standing Pt Will Perform Lower Body Dressing: with modified independence;sit to/from stand Pt Will Transfer to Toilet: with modified independence;ambulating;regular height toilet  OT Frequency: Min 1X/week    Co-evaluation              AM-PAC OT "6 Clicks" Daily Activity     Outcome Measure Help from another person eating meals?: None Help from another person taking care of personal grooming?: None Help from another person toileting, which includes using toliet, bedpan, or urinal?: A Little Help from another person bathing (including washing, rinsing, drying)?: A Little Help from another  person to put on and taking off regular upper body clothing?: None Help from another person to put on and taking off regular lower body clothing?: A Little 6 Click Score: 21   End of Session Equipment Utilized During Treatment: Gait belt;Rolling walker (2 wheels) Nurse Communication: Mobility status  Activity Tolerance: Patient tolerated treatment well Patient left: in bed;with call bell/phone within reach;with family/visitor present  OT Visit Diagnosis: Unsteadiness on feet (R26.81);Muscle weakness (generalized) (M62.81)                Time: 7846-9629 OT Time Calculation (min): 21 min Charges:  OT General Charges $OT Visit: 1 Visit OT Evaluation $OT Eval Moderate Complexity: 1 Mod  12/01/2022  RP, OTR/L  Acute Rehabilitation Services  Office:  [phone]   Suzanna Obey 12/01/2022, 3:52 PM

## 2022-12-01 NOTE — Progress Notes (Signed)
Advanced Heart Failure Rounding Note  PCP-Cardiologist: None   Subjective:   Admitted with late presenting NSTEMI>acute systolic CHF  RHC 11/18 with severely elevated biventricular filling pressures, though cardiac output is reassuringly preserved. RA 19, PA 63/35 (44), PCWP 34 with v waves to 50, Fick CO/CI 5.72/3.23, PAPi 1.47  Got 80 IV lasix BID yesterday. Only documented in UOP. Weight down 4 lbs.   SCr 1.54>1.44  Feels much better today. Had to get I&O cath'd last night. Suspect I&O not accurate.  Objective:   Weight Range: 73.8 kg Body mass index is 29.76 kg/m.   Vital Signs:   Temp:  [97.5 F (36.4 C)-98.4 F (36.9 C)] 97.7 F (36.5 C) (11/19 0717) Pulse Rate:  [0-84] 73 (11/19 0734) Resp:  [14-21] 16 (11/19 0717) BP: (91-114)/(51-75) 108/61 (11/19 0734) SpO2:  [82 %-98 %] 96 % (11/19 0734) Weight:  [73.8 kg] 73.8 kg (11/19 0345) Last BM Date : 11/27/22  Weight change: Filed Weights   11/29/22 0332 11/30/22 0322 12/01/22 0345  Weight: 75.5 kg 75.6 kg 73.8 kg    Intake/Output:   Intake/Output Summary (Last 24 hours) at 12/01/2022 0743 Last data filed at 12/01/2022 0731 Gross per 24 hour  Intake 180 ml  Output 1916 ml  Net -1736 ml      Physical Exam    General:  elderly appearing.  No respiratory difficulty HEENT: HOH Neck: supple. JVD ~12 with v waves to jaw. Carotids 2+ bilat; no bruits. No lymphadenopathy or thyromegaly appreciated. Cor: PMI nondisplaced. Regular rate & rhythm. No rubs, gallops or murmurs. Lungs: clear Abdomen: soft, nontender, nondistended. No hepatosplenomegaly. No bruits or masses. Good bowel sounds. Extremities: no cyanosis, clubbing, rash, edema  Neuro: alert & oriented x 3, cranial nerves grossly intact. moves all 4 extremities w/o difficulty. Affect pleasant.   Telemetry   NSR 70s (Personally reviewed)    EKG    No new EKG to review  Labs    CBC Recent Labs    11/30/22 0737 11/30/22 1528  12/01/22 0658  WBC 8.6  --  7.5  HGB 8.9* 9.5*  9.5* 8.8*  HCT 26.8* 28.0*  28.0* 26.1*  MCV 89.9  --  89.4  PLT 236  --  209   Basic Metabolic Panel Recent Labs    16/10/96 0737 11/30/22 1528 11/30/22 1803  NA 121* 124*  124* 123*  K 4.1 3.9  3.9 4.0  CL 92*  --  91*  CO2 19*  --  19*  GLUCOSE 127*  --  119*  BUN 37*  --  36*  CREATININE 1.75*  --  1.54*  CALCIUM 8.5*  --  8.4*   Liver Function Tests No results for input(s): "AST", "ALT", "ALKPHOS", "BILITOT", "PROT", "ALBUMIN" in the last 72 hours. No results for input(s): "LIPASE", "AMYLASE" in the last 72 hours. Cardiac Enzymes No results for input(s): "CKTOTAL", "CKMB", "CKMBINDEX", "TROPONINI" in the last 72 hours.  BNP: BNP (last 3 results) Recent Labs    11/27/22 1401  BNP 1,369.0*    ProBNP (last 3 results) No results for input(s): "PROBNP" in the last 8760 hours.   D-Dimer No results for input(s): "DDIMER" in the last 72 hours. Hemoglobin A1C No results for input(s): "HGBA1C" in the last 72 hours. Fasting Lipid Panel No results for input(s): "CHOL", "HDL", "LDLCALC", "TRIG", "CHOLHDL", "LDLDIRECT" in the last 72 hours. Thyroid Function Tests No results for input(s): "TSH", "T4TOTAL", "T3FREE", "THYROIDAB" in the last 72 hours.  Invalid input(s): "  FREET3"  Other results:   Imaging    US RENAL  Result Date: 11/30/2022 CLINICAL DATA:  Acute renal injury EXAM: RENAL / URINARY TRACT ULTRASOUND COMPLETE COMPARISON:  None Available. FINDINGS: Right Kidney: Renal measurements: 10.0 x 4.0 x 5.5 cm. = volume: 114 mL. Echogenicity within normal limits. No mass or hydronephrosis visualized. Left Kidney: Renal measurements: 9.3 x 4.6 x 4.2 cm. = volume: 93 mL. Echogenicity within normal limits. No mass or hydronephrosis visualized. Bladder: Decompressed Other: None. IMPRESSION: No acute abnormality noted. Electronically Signed   By: Alcide Clever M.D.   On: 11/30/2022 21:23   CARDIAC  CATHETERIZATION  Result Date: 11/30/2022 HEMODYNAMICS: RA:       19 mmHg (mean) RV:       63/13, 21 mmHg PA:       63/35 mmHg (44 mean) PCWP: 34 mmHg (mean) with v waves to 50    Estimated Fick CO/CI   5.72L/min, 3.23L/min/m2    TPG  10  mmHg     PVR  1.7 Wood Units PAPi  1.47  IMPRESSION: Severely elevated left and right sided filling pressures. 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 Normal cardiac output and index by assumed fick method. SVR of 770 dynes/sec/cm-5. Severe coronary artery calcifications noted on fluoro. RECOMMENDATIONS: Would continue aggressive diuresis. RHC not consistent with low output heart failure. Vasodilatory state with low SVR.     Medications:     Scheduled Medications:  aspirin EC  81 mg Oral Daily   furosemide  80 mg Intravenous BID   gabapentin  300 mg Oral TID   insulin aspart  0-15 Units Subcutaneous TID WC   insulin aspart  0-5 Units Subcutaneous QHS   midodrine  10 mg Oral BID WC   pantoprazole (PROTONIX) IV  40 mg Intravenous BID   simvastatin  40 mg Oral QPM   sodium bicarbonate  650 mg Oral BID   sodium chloride flush  10 mL Intravenous Q12H    Infusions:  amiodarone 30 mg/hr (12/01/22 0631)   heparin 1,000 Units/hr (11/30/22 1754)    PRN Medications: acetaminophen **OR** acetaminophen, guaiFENesin-dextromethorphan, ondansetron **OR** ondansetron (ZOFRAN) IV, polyethylene glycol, prochlorperazine  Patient Profile   82 y.o. female with no prior cardiac history. Known history of HTN, DM, HLD. Admitted with acute systolic CHF and NSTEMI.   Assessment/Plan  Acute systolic CHF: -Suspect ischemic cardiomyopathy -Echo: EF 25-30%, RWMA, RV mildly reduced, moderate to severe MR -NYHA IV. Volume overloaded. Diuretics intermittently held d/t hypotension.   -RHC 11/18 with severely elevated biventricular filling pressures, though cardiac output is reassuringly preserved to definitively assess hemodynamics.  -May  need inotrope support with milrinone +/-NE, would be palliative approach if she does not respond well to diuresis.  - Would continue diuresis with 80 IV BID today.  Renal function improving.  - She is currently on midodrine 10 TID with SBP 90s-low 100s. -GDMT limited by hypotension/AKI   2. NSTEMI: -Suspect late presentation MI -HS troponin ~ 1800 with flat trend -On simvastatin. Continue aspirin. On heparin gtt -Consider LHC once more stable   3. Mitral regurgitation: -Moderate to severe on echo -? Ischemic MR   4. AKI: -Scr baseline uncertain, 1.27 on admit -Scr peaked at 1.75. Down trending with diuresis. 1.44 today -In setting of volume overload and suspected low-output HF -Nephrology following   5. DM: -A1c 6.8% - Per primary   6. Hypervolemic hyponatremia -Na 124 -Diurese aggressively   7. Atrial fibrillation -New onset  11/17 -Converted to SR with IV amiodarone -Continue amiodarone at 30/hr   8. Anemia -Hgb 8s-9s -Follow with anticoagulation   GOC:  -Palliative Care on board.  -Now DNR/DNI.  -Depending on her progress over the next few days may need to consider transition to comfort measures  Length of Stay: 4  Alen Bleacher, NP  12/01/2022, 7:43 AM  Advanced Heart Failure Team Pager [phone] (M-F; 7a - 5p)  Please contact [facility] for night-coverage after hours (5p -7a ) and weekends on [email or web address]

## 2022-12-01 NOTE — Plan of Care (Signed)
  Problem: Education: Goal: Knowledge of General Education information will improve Description: Including pain rating scale, medication(s)/side effects and non-pharmacologic comfort measures Outcome: Progressing   Problem: Health Behavior/Discharge Planning: Goal: Ability to manage health-related needs will improve Outcome: Progressing   Problem: Clinical Measurements: Goal: Ability to maintain clinical measurements within normal limits will improve Outcome: Progressing Goal: Will remain free from infection Outcome: Progressing Goal: Diagnostic test results will improve Outcome: Progressing Goal: Respiratory complications will improve Outcome: Progressing Goal: Cardiovascular complication will be avoided Outcome: Progressing   Problem: Activity: Goal: Risk for activity intolerance will decrease Outcome: Progressing   Problem: Elimination: Goal: Will not experience complications related to bowel motility Outcome: Progressing   Problem: Safety: Goal: Ability to remain free from injury will improve Outcome: Progressing   Problem: Skin Integrity: Goal: Risk for impaired skin integrity will decrease Outcome: Progressing   Problem: Activity: Goal: Capacity to carry out activities will improve Outcome: Progressing   Problem: Cardiac: Goal: Ability to achieve and maintain adequate cardiopulmonary perfusion will improve Outcome: Progressing   Problem: Fluid Volume: Goal: Ability to maintain a balanced intake and output will improve Outcome: Progressing

## 2022-12-01 NOTE — Progress Notes (Signed)
   Palliative Medicine Inpatient Follow Up Note  HPI: Debra Grimes is a 82 y.o. female with medical history significant of diabetes, hypertension, hyperlipidemia presents with several days of shortness of breath and generally feeling fairly poor, with bilateral lower extremity edema. She was admitted for NSTEMI and acute CHF. Palliative care consulted to support additional goals of care conversations.   Today's Discussion 12/01/2022  *Please note that this is a verbal dictation therefore any spelling or grammatical errors are due to the "Dragon Medical One" system interpretation.  Chart reviewed inclusive of vital signs, progress notes, laboratory results, and diagnostic images. Continue to have incremental hypotension with sitting up though midodrine does appear to be helping.   I met with Debra Grimes at bedside this morning. She is awake and alert in the bed. She shares that she is feeling improved overall this morning. She shares that her kidneys have "started working again" as she was able to get to the bedside commode this morning to void.  Debra Grimes shares that she has a good appetite this morning also and it noted to have eaten more than 80% of her meal.  We reviewed the plan for the day inclusive of ongoing diuresis. We discussed the importance of daily mobility.   Patient shares her hope to get back home. She shares ongoing optimism for improvement.  Questions and concerns addressed/Palliative Support Provided.   Objective Assessment: Vital Signs Vitals:   12/01/22 0734 12/01/22 0902  BP: 108/61 (!) 91/51  Pulse: 73 77  Resp:    Temp:    SpO2: 96% 99%    Intake/Output Summary (Last 24 hours) at 12/01/2022 0945 Last data filed at 12/01/2022 0900 Gross per 24 hour  Intake 300 ml  Output 1425 ml  Net -1125 ml   Last Weight  Most recent update: 12/01/2022  3:48 AM    Weight  73.8 kg (162 lb 11.2 oz)            Gen:  Elderly Caucasian F in NAD HEENT: moist mucous  membranes CV: regular rate and irregular rhythm  PULM:  On 2LPM [place], breathing is nonlabored ABD: soft/nontender/nondistended  EXT: (+)1 BLE  edema  Neuro: Alert and oriented x3 - hard of hearing  SUMMARY OF RECOMMENDATIONS   DNAR/DNI   Continue current care allowing time for outcomes --> LHC is still being considered if patient stabilizes further    Patients goals are for improvement in function   Ongoing PMT support  Total Time: 35 ______________________________________________________________________________________ Lamarr Lulas Zavala Palliative Medicine Team Team Cell Phone: [phone] Please utilize secure chat with additional questions, if there is no response within 30 minutes please call the above phone number  Palliative Medicine Team providers are available by phone from 7am to 7pm daily and can be reached through the team cell phone.  Should this patient require assistance outside of these hours, please call the patient's attending physician.

## 2022-12-01 NOTE — Telephone Encounter (Signed)

## 2022-12-01 NOTE — Progress Notes (Signed)
[facility]                                                                               Debra Grimes, is a 82 y.o. female, DOB - [birth year], NUU:[id number] Admit date - 11/27/2022    Outpatient Primary MD for the patient is [facility]  LOS - 4  days    Brief summary    Debra Grimes is a 82 y.o. female with medical history significant of diabetes, hypertension, hyperlipidemia presents with several days of shortness of breath and generally feeling fairly poor, with bilateral lower extremity edema.  She was admitted for NSTEMI and acute CHF. She is slowly improving. Cardiology and nephrology on board  Assessment & Plan    Assessment and Plan:   Acute respiratory failure with hypoxia secondary to NSTEMI AND Acute systolic CHF/ Ischemic cardiomyopathy/ Severe MR on echocardiogram:  She was started on  IV heparin and IV lasix 40 mg BID cardiology increased the dose to 80 mg BID a dose of acetazolamide., not much diuresis since admission.  Daily weights and strict intake and output. Echocardiogram done and reviewed with family.  Cardiology on board, and following.  She is weaned off oxygen.     Acute hypervolemic hyponatremia:  Sodium still hovering around 120's TSH wnl. Serum osmo is 275, urine sodium is 54 , cortisol level 12.1, Urine osmolality is 355.  New onset of atrial fibrillation In NSR.  With soft BP parameters.  Rate control with amiodarone gtt.   AKI;  Suspect cardio renal syndrome , nephrology on board.  Metabolic acidosis non anion gap.   On sodium bicarb tablets.      Hyperkalemia:  Resolved with lokelma.  Repeat K WNL.    Type 2 DM;  With hyperglycemia CBG (last 3)  Recent Labs    11/30/22 1636 11/30/22 2057 12/01/22 0716  GLUCAP 123* 143* 120*   Resume SSI for now.  Gabapentin for diabetic neuropathy.  Hemoglobin A1c is 6.8%  GERD On PPI.    Anemia of chronic disease Hemoglobin around 9.  Stool  for occult blood is pending.    Hypertension:  BP parameters  remain borderline low.    Hyperlipidemia:  Resume statin.   In view of her age, and multiple medical problems, and new ischemic cardiomyopathy, she has poor prognosis. Requested palliative care consult. Ongoing discussions. Family is very clear on the goals of care, to see if she will improve with diuresis if not, they would like hospice services on discharge.   Estimated body mass index is 29.76 kg/m as calculated from the following:   Height as of this encounter: 5\' 2"  (1.575 m).   Weight as of this encounter: 73.8 kg.  Code Status: DNR LImited. DVT Prophylaxis:  Heparin.    Level of Care: Level of care: Progressive Family Communication: None at bedside.   Disposition Plan:     Remains inpatient appropriate:  waiting for improvement.   Procedures:  Echocardiogram.  Consultants:   Cardiology Palliative care.  Nephrology.   Antimicrobials:   Anti-infectives (From admission, onward)    None        Medications  Scheduled Meds:  acetaZOLAMIDE ER  500 mg Oral Once   aspirin EC  81 mg Oral Daily   furosemide  80 mg Intravenous BID   gabapentin  300 mg Oral TID   insulin aspart  0-15 Units Subcutaneous TID WC   insulin aspart  0-5 Units Subcutaneous QHS   pantoprazole  40 mg Oral BID   simvastatin  40 mg Oral QPM   sodium bicarbonate  650 mg Oral BID   sodium chloride flush  10 mL Intravenous Q12H   Continuous Infusions:  albumin human 25 g (12/01/22 0955)   amiodarone 30 mg/hr (12/01/22 0631)   heparin 1,000 Units/hr (11/30/22 1754)   PRN Meds:.acetaminophen **OR** acetaminophen, guaiFENesin-dextromethorphan, ondansetron **OR** ondansetron (ZOFRAN) IV, polyethylene glycol, prochlorperazine    Subjective:   Debra Grimes was seen and examined today.  In the chair, off oxygen, no chest pain. Sob improving. No nausea or vomiting.    Objective:   Vitals:   12/01/22 0345 12/01/22 0717  12/01/22 0734 12/01/22 0902  BP: (!) 100/55 (!) 91/51 108/61 (!) 91/51  Pulse: 70 66 73 77  Resp: 16 16    Temp: 97.6 F (36.4 C) 97.7 F (36.5 C)    TempSrc: Oral Oral    SpO2: 95% 98% 96% 99%  Weight: 73.8 kg     Height:        Intake/Output Summary (Last 24 hours) at 12/01/2022 1003 Last data filed at 12/01/2022 0900 Gross per 24 hour  Intake 300 ml  Output 1425 ml  Net -1125 ml   Filed Weights   11/29/22 0332 11/30/22 0322 12/01/22 0345  Weight: 75.5 kg 75.6 kg 73.8 kg     Exam General exam: ill appearing elderly lady, not in distress.  Respiratory system: remains on RA, air entry fair.  Cardiovascular system: S1 & S2 heard, RRR.  Gastrointestinal system: Abdomen is soft, non tender non distended.  Central nervous system: Alert and oriented.  Extremities: pedal edema improving.  Skin: No rashes,  Psychiatry: Mood & affect appropriate.     Data Reviewed:  I have personally reviewed following labs and imaging studies   CBC Lab Results  Component Value Date   WBC 7.5 12/01/2022   RBC 2.92 (L) 12/01/2022   HGB 8.8 (L) 12/01/2022   HCT 26.1 (L) 12/01/2022   MCV 89.4 12/01/2022   MCH 30.1 12/01/2022   PLT 209 12/01/2022   MCHC 33.7 12/01/2022   RDW 14.1 12/01/2022   LYMPHSABS 1.7 08/21/2014   MONOABS 0.5 08/21/2014   EOSABS 0.2 08/21/2014   BASOSABS 0.0 08/21/2014     Last metabolic panel Lab Results  Component Value Date   NA 124 (L) 12/01/2022   K 4.1 12/01/2022   CL 92 (L) 12/01/2022   CO2 19 (L) 12/01/2022   BUN 34 (H) 12/01/2022   CREATININE 1.44 (H) 12/01/2022   GLUCOSE 114 (H) 12/01/2022   GFRNONAA 36 (L) 12/01/2022   GFRAA >60 12/25/2014   CALCIUM 8.1 (L) 12/01/2022   PROT 7.1 11/27/2022   ALBUMIN 2.7 (L) 12/01/2022   BILITOT 0.7 11/27/2022   ALKPHOS 42 11/27/2022   AST 20 11/27/2022   ALT 17 11/27/2022   ANIONGAP 13 12/01/2022    CBG (last 3)  Recent Labs    11/30/22 1636 11/30/22 2057 12/01/22 0716  GLUCAP 123* 143* 120*       Coagulation Profile: No results for input(s): "INR", "PROTIME" in the last 168 hours.   Radiology Studies: US RENAL  Result Date: 11/30/2022 CLINICAL DATA:  Acute renal injury EXAM: RENAL / URINARY TRACT ULTRASOUND COMPLETE COMPARISON:  None Available. FINDINGS: Right Kidney: Renal measurements: 10.0 x 4.0 x 5.5 cm. = volume: 114 mL. Echogenicity within normal limits. No mass or hydronephrosis visualized. Left Kidney: Renal measurements: 9.3 x 4.6 x 4.2 cm. = volume: 93 mL. Echogenicity within normal limits. No mass or hydronephrosis visualized. Bladder: Decompressed Other: None. IMPRESSION: No acute abnormality noted. Electronically Signed   By: Alcide Clever M.D.   On: 11/30/2022 21:23   CARDIAC CATHETERIZATION  Result Date: 11/30/2022 HEMODYNAMICS: RA:       19 mmHg (mean) RV:       63/13, 21 mmHg PA:       63/35 mmHg (44 mean) PCWP: 34 mmHg (mean) with v waves to 50    Estimated Fick CO/CI   5.72L/min, 3.23L/min/m2    TPG  10  mmHg     PVR  1.7 Wood Units PAPi  1.47  IMPRESSION: Severely elevated left and right sided filling pressures. 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 Normal cardiac output and index by assumed fick method. SVR of 770 dynes/sec/cm-5. Severe coronary artery calcifications noted on fluoro. RECOMMENDATIONS: Would continue aggressive diuresis. RHC not consistent with low output heart failure. Vasodilatory state with low SVR.       Kathlen Mody M.D. [facility] 12/01/2022, 10:03 AM  Available via Epic secure chat 7am-7pm After 7 pm, please refer to night coverage provider listed on amion.

## 2022-12-01 NOTE — Progress Notes (Signed)
PHARMACY - ANTICOAGULATION CONSULT NOTE  Pharmacy Consult for heparin Indication: chest pain/ACS  Allergies  Allergen Reactions   Hydrocodone    Oxycodone-Acetaminophen    Penicillins     Patient Measurements: Height: 5\' 2"  (157.5 cm) Weight: 73.8 kg (162 lb 11.2 oz) IBW/kg (Calculated) : 50.1 Heparin Dosing Weight: 68.7kg  Vital Signs: Temp: 97.7 F (36.5 C) (11/19 0717) Temp Source: Oral (11/19 0717) BP: 91/51 (11/19 0902) Pulse Rate: 77 (11/19 0902)  Labs: Recent Labs    11/29/22 0555 11/29/22 1800 11/30/22 0737 11/30/22 1528 11/30/22 1803 12/01/22 0658  HGB 8.9*  --  8.9* 9.5*  9.5*  --  8.8*  HCT 26.9*  --  26.8* 28.0*  28.0*  --  26.1*  PLT 230  --  236  --   --  209  HEPARINUNFRC 0.56  --  0.61  --   --  0.34  CREATININE 1.35*   < > 1.75*  --  1.54* 1.44*   < > = values in this interval not displayed.    Estimated Creatinine Clearance: 27.9 mL/min (A) (by C-G formula based on SCr of 1.44 mg/dL (H)).   Medical History: Past Medical History:  Diagnosis Date   Arthritis    Hearing loss    Hypercholesteremia    Hypertension    Macular degeneration    Type 2 diabetes mellitus (HCC)    Assessment: 82 year old female admitted to [facility] with leg swelling and shortness of breath. Cardiac enzymes elevated. She is on IV heparin for rule out ACS and afib. Patient does not appear to be on anticoagulation prior to admit.   Heparin level 0.34 therapeutic on 1000 units/hr. Hgb 8  Goal of Therapy:  Heparin level 0.3-0.7 units/ml Monitor platelets by anticoagulation protocol: Yes   Plan:  Continue heparin 1000 units/hr  Daily heparin level and CBC   Harland German, PharmD Clinical Pharmacist **Pharmacist phone directory can now be found on [email or web address] ([email or web address]).  Listed under [facility].

## 2022-12-02 DIAGNOSIS — Z7189 Other specified counseling: Secondary | ICD-10-CM | POA: Diagnosis not present

## 2022-12-02 DIAGNOSIS — Z515 Encounter for palliative care: Secondary | ICD-10-CM | POA: Diagnosis not present

## 2022-12-02 DIAGNOSIS — I214 Non-ST elevation (NSTEMI) myocardial infarction: Secondary | ICD-10-CM | POA: Diagnosis not present

## 2022-12-02 LAB — BASIC METABOLIC PANEL
Anion gap: 12 (ref 5–15)
BUN: 42 mg/dL — ABNORMAL HIGH (ref 8–23)
CO2: 20 mmol/L — ABNORMAL LOW (ref 22–32)
Calcium: 8.3 mg/dL — ABNORMAL LOW (ref 8.9–10.3)
Chloride: 90 mmol/L — ABNORMAL LOW (ref 98–111)
Creatinine, Ser: 1.83 mg/dL — ABNORMAL HIGH (ref 0.44–1.00)
GFR, Estimated: 27 mL/min — ABNORMAL LOW (ref >60)
Glucose, Bld: 165 mg/dL — ABNORMAL HIGH (ref 70–99)
Potassium: 4 mmol/L (ref 3.5–5.1)
Sodium: 122 mmol/L — ABNORMAL LOW (ref 135–145)

## 2022-12-02 LAB — CBC
HCT: 23 % — ABNORMAL LOW (ref 36.0–46.0)
Hemoglobin: 7.8 g/dL — ABNORMAL LOW (ref 12.0–15.0)
MCH: 29.4 pg (ref 26.0–34.0)
MCHC: 33.9 g/dL (ref 30.0–36.0)
MCV: 86.8 fL (ref 80.0–100.0)
Platelets: 210 10*3/uL (ref 150–400)
RBC: 2.65 MIL/uL — ABNORMAL LOW (ref 3.87–5.11)
RDW: 14.3 % (ref 11.5–15.5)
WBC: 6.3 10*3/uL (ref 4.0–10.5)
nRBC: 0 % (ref 0.0–0.2)

## 2022-12-02 LAB — GLUCOSE, CAPILLARY
Glucose-Capillary: 178 mg/dL — ABNORMAL HIGH (ref 70–99)
Glucose-Capillary: 213 mg/dL — ABNORMAL HIGH (ref 70–99)
Glucose-Capillary: 189 mg/dL — ABNORMAL HIGH (ref 70–99)
Glucose-Capillary: 221 mg/dL — ABNORMAL HIGH (ref 70–99)

## 2022-12-02 LAB — HEPARIN LEVEL (UNFRACTIONATED): Heparin Unfractionated: 0.34 [IU]/mL (ref 0.30–0.70)

## 2022-12-02 MED ORDER — ACETAZOLAMIDE 250 MG PO TABS
500.0000 mg | ORAL_TABLET | Freq: Once | ORAL | Status: AC
  Administered 2022-12-02: 500 mg via ORAL
  Filled 2022-12-02: qty 2

## 2022-12-02 MED ORDER — APIXABAN 2.5 MG PO TABS
2.5000 mg | ORAL_TABLET | Freq: Two times a day (BID) | ORAL | Status: DC
  Administered 2022-12-02: 2.5 mg via ORAL
  Filled 2022-12-02: qty 1

## 2022-12-02 MED ORDER — ACETAMINOPHEN 325 MG PO TABS
650.0000 mg | ORAL_TABLET | Freq: Four times a day (QID) | ORAL | Status: DC | PRN
  Administered 2022-12-02 – 2022-12-03 (×2): 650 mg via ORAL
  Filled 2022-12-02 (×2): qty 2

## 2022-12-02 MED ORDER — FUROSEMIDE 10 MG/ML IJ SOLN
120.0000 mg | Freq: Two times a day (BID) | INTRAVENOUS | Status: DC
  Administered 2022-12-02: 120 mg via INTRAVENOUS
  Filled 2022-12-02: qty 10
  Filled 2022-12-02 (×2): qty 12

## 2022-12-02 MED ORDER — TOLVAPTAN 15 MG PO TABS
15.0000 mg | ORAL_TABLET | Freq: Once | ORAL | Status: DC
  Filled 2022-12-02: qty 1

## 2022-12-02 MED ORDER — FUROSEMIDE 10 MG/ML IJ SOLN
40.0000 mg | Freq: Once | INTRAMUSCULAR | Status: AC
  Administered 2022-12-02: 40 mg via INTRAVENOUS
  Filled 2022-12-02: qty 4

## 2022-12-02 MED ORDER — TOLVAPTAN 15 MG PO TABS
15.0000 mg | ORAL_TABLET | Freq: Once | ORAL | Status: AC
  Administered 2022-12-02: 15 mg via ORAL
  Filled 2022-12-02: qty 1

## 2022-12-02 NOTE — Progress Notes (Signed)
PHARMACY - ANTICOAGULATION CONSULT NOTE  Pharmacy Consult for heparin Indication: chest pain/ACS  Allergies  Allergen Reactions   Hydrocodone    Oxycodone-Acetaminophen    Penicillins     Patient Measurements: Height: 5\' 2"  (157.5 cm) Weight: 75.4 kg (166 lb 3.6 oz) IBW/kg (Calculated) : 50.1 Heparin Dosing Weight: 68.7kg  Vital Signs: Temp: 97.7 F (36.5 C) (11/20 0350) Temp Source: Oral (11/20 0350) BP: 93/56 (11/20 0350) Pulse Rate: 66 (11/20 0350)  Labs: Recent Labs    11/30/22 0737 11/30/22 1528 11/30/22 1803 12/01/22 0658 12/01/22 1934 12/02/22 0548  HGB 8.9* 9.5*  9.5*  --  8.8*  --  7.8*  HCT 26.8* 28.0*  28.0*  --  26.1*  --  23.0*  PLT 236  --   --  209  --  210  HEPARINUNFRC 0.61  --   --  0.34  --  0.34  CREATININE 1.75*  --    < > 1.44* 1.79* 1.83*   < > = values in this interval not displayed.    Estimated Creatinine Clearance: 22.1 mL/min (A) (by C-G formula based on SCr of 1.83 mg/dL (H)).   Medical History: Past Medical History:  Diagnosis Date   Arthritis    Hearing loss    Hypercholesteremia    Hypertension    Macular degeneration    Type 2 diabetes mellitus (HCC)    Assessment: 82 year old female admitted to [facility] with leg swelling and shortness of breath. Cardiac enzymes elevated. She is on IV heparin for rule out ACS and afib. Patient does not appear to be on anticoagulation prior to admit.   Heparin level 0.34 therapeutic, CBC stable.  Goal of Therapy:  Heparin level 0.3-0.7 units/ml Monitor platelets by anticoagulation protocol: Yes   Plan:  Continue heparin 1000 units/hr  Daily heparin level and CBC   Fredonia Highland, PharmD, [place], [facility] Clinical Pharmacist [phone] Please check AMION for all [facility] numbers 12/02/2022

## 2022-12-02 NOTE — TOC Initial Note (Addendum)
Transition of Care [facility]) - Initial/Assessment Note    Patient Details  Name: Debra Grimes MRN: [id number] Date of Birth: Jan 14, 1940  [facility]) CM/SW Contact:    Elliot Cousin, RN Phone Number:[phone] 12/02/2022, 1:54 PM  Clinical Narrative:      CM spoke to pt's dtr, Greta at bedside. Offered choice for Home Hospice (medicare.gov list placed in chart with ratings). Requested [facility].  Contacted [facility] with new referral. Dtr states to contact dtr, Abra to set up DME and Home Hospice.  Pt lives alone but dtr, Tristan Schroeder will be staying with patient. Delsa Bern lives next door also.                Expected Discharge Plan: Home w Hospice Care Barriers to Discharge: Continued Medical Work up   Patient Goals and CMS Choice Patient states their goals for this hospitalization and ongoing recovery are:: wants patient to recover CMS Medicare.gov Compare Post Acute Care list provided to:: Patient Represenative (must comment) (daughter Dewaine Oats) Choice offered to / list presented to : Adult Children      Expected Discharge Plan and Services   Discharge Planning Services: CM Consult Post Acute Care Choice: Hospice Living arrangements for the past 2 months: Single Family Home                           HH Arranged: RN [facility] Agency: [facility] (Gertie Exon) Date HH Agency Contacted: 12/02/22 Time HH Agency Contacted: 1353 Representative spoke with at [facility] Agency: Myna Hidalgo  Prior Living Arrangements/Services Living arrangements for the past 2 months: Single Family Home Lives with:: Self Patient language and need for interpreter reviewed:: Yes Do you feel safe going back to the place where you live?: Yes      Need for Family Participation in Patient Care: Yes (Comment) Care giver support system in place?: Yes (comment)   Criminal Activity/Legal Involvement Pertinent to Current Situation/Hospitalization: No - Comment as  needed  Activities of Daily Living   ADL Screening (condition at time of admission) Independently performs ADLs?: Yes (appropriate for developmental age) Is the patient deaf or have difficulty hearing?: Yes Does the patient have difficulty seeing, even when wearing glasses/contacts?: No Does the patient have difficulty concentrating, remembering, or making decisions?: No  Permission Sought/Granted Permission sought to share information with : Case Manager, PCP, Family Supports Permission granted to share information with : Yes, Verbal Permission Granted  Share Information with NAME: Stefani Dama  Permission granted to share info w AGENCY: Home Hospice  Permission granted to share info w Relationship: daughter  Permission granted to share info w Contact Information: [phone]  Emotional Assessment Appearance:: Appears stated age Attitude/Demeanor/Rapport: Lethargic Affect (typically observed): Appropriate Orientation: : Oriented to Situation, Oriented to  Time, Oriented to Place, Oriented to Self Alcohol / Substance Use: Not Applicable Psych Involvement: No (comment)  Admission diagnosis:  NSTEMI (non-ST elevated myocardial infarction) [facility]) [I21.4] Patient Active Problem List   Diagnosis Date Noted   Acute clinical systolic heart failure (HCC) 11/27/2022   NSTEMI (non-ST elevated myocardial infarction) (HCC) 11/27/2022   Hyponatremia 11/27/2022   Metabolic acidosis 11/27/2022   Hypertension    Hypercholesteremia    CLOSED FRACTURE OF PATELLA 05/10/2008   Diabetes mellitus without complication (HCC) 05/09/2008   PCP:  [facility]:   [facility] [place], [place] - [address]  [place] [address] Phone: [phone] Fax: [phone]  [facility] [address] [place] [address] Phone: [phone] Fax: [phone]     Social Determinants of Health (SDOH) Social  History: SDOH Screenings   Food Insecurity: No Food Insecurity (11/27/2022)  Housing: Low Risk  (11/27/2022)  Transportation Needs: No Transportation Needs (11/27/2022)  Utilities: Not At Risk (11/27/2022)  Tobacco Use: Low Risk  (11/27/2022)   SDOH Interventions:     Readmission Risk Interventions     No data to display

## 2022-12-02 NOTE — Evaluation (Signed)
Physical Therapy Evaluation Patient Details Name: Debra Grimes MRN: [id number] DOB: Sep 24, 1940 Today's Date: 12/02/2022  History of Present Illness  82 y.o. female admitted 11/15 with several days of shortness of breath with bilateral lower extremity edema. She was admitted for NSTEMI and acute CHF.  PMH: diabetes, hypertension, hyperlipidemia  Clinical Impression  Pt admitted with above diagnosis. Pt was able to ambulate with rollator with  min cues and CGA.  No LOB. Daughter present and was educated in how to cue pt and assist pt for home. Daughter states that Hospice will be involbed at home and that family will provide 24 hour care.  Will follow acutely. Pt currently with functional limitations due to the deficits listed below (see PT Problem List). Pt will benefit from acute skilled PT to increase their independence and safety with mobility to allow discharge.           If plan is discharge home, recommend the following: A little help with walking and/or transfers;A little help with bathing/dressing/bathroom;Assistance with cooking/housework;Assist for transportation;Help with stairs or ramp for entrance   Can travel by private vehicle        Equipment Recommendations Rollator (4 wheels);BSC/3in1  Recommendations for Other Services       Functional Status Assessment Patient has had a recent decline in their functional status and demonstrates the ability to make significant improvements in function in a reasonable and predictable amount of time.     Precautions / Restrictions Precautions Precautions: Fall Precaution Comments: Watch HR and BP Restrictions Weight Bearing Restrictions: No      Mobility  Bed Mobility Overal bed mobility: Needs Assistance Bed Mobility: Supine to Sit     Supine to sit: Min assist     General bed mobility comments: Needed a little assist to come to eOB.    Transfers Overall transfer level: Needs assistance Equipment used: Rollator (4  wheels) Transfers: Sit to/from Stand, Bed to chair/wheelchair/BSC Sit to Stand: Min assist, From elevated surface           General transfer comment: Pt was being educated regarding use of rollator and needed min cues and min assist as she struggles to hear and had difficulty with learning the brakes and hand placement etc.    Ambulation/Gait Ambulation/Gait assistance: Min assist Gait Distance (Feet): 150 Feet Assistive device: Rollator (4 wheels) Gait Pattern/deviations: Step-through pattern, Decreased stride length, Drifts right/left, Trunk flexed   Gait velocity interpretation: <1.31 ft/sec, indicative of household ambulator   General Gait Details: Pt able to ambulate with rollator to hallway.  Showed pt how to rest sitting on rollator and then pt walked back to room. daughter present and also educated regarding use of rollator so she can cue pt.  Stairs            Wheelchair Mobility     Tilt Bed    Modified Rankin (Stroke Patients Only)       Balance Overall balance assessment: Needs assistance Sitting-balance support: Feet supported Sitting balance-Leahy Scale: Good     Standing balance support: Reliant on assistive device for balance Standing balance-Leahy Scale: Fair Standing balance comment: can static stand without RW but does better with UE support                             Pertinent Vitals/Pain Pain Assessment Pain Assessment: No/denies pain    Home Living Family/patient expects to be discharged to:: Private residence Living Arrangements: Alone  Available Help at Discharge: Family;Available PRN/intermittently Type of Home: House Home Access: Stairs to enter Entrance Stairs-Rails: Left Entrance Stairs-Number of Steps: 2   Home Layout: One level Home Equipment: None Additional Comments: Has an old 3n1 and RW from spouse.  Family unsure of condition.    Prior Function Prior Level of Function : Independent/Modified  Independent;Driving             Mobility Comments: Not using AD for mobility, enjoyed gardening ADLs Comments: No assist with ADL, iADL.  Able to managem meds and continued to drive up until a few weeks ago.     Extremity/Trunk Assessment   Upper Extremity Assessment Upper Extremity Assessment: Defer to OT evaluation    Lower Extremity Assessment Lower Extremity Assessment: Generalized weakness    Cervical / Trunk Assessment Cervical / Trunk Assessment: Kyphotic  Communication   Communication Communication: Hearing impairment  Cognition Arousal: Alert Behavior During Therapy: WFL for tasks assessed/performed Overall Cognitive Status: Within Functional Limits for tasks assessed                                          General Comments General comments (skin integrity, edema, etc.): Supine 68 bpm, 98%RA, 102/57; sitting 72 bpm, 91/56; standing 71 bpm, 94/45; standing 3 min 72 bpm, 95/49; end of rx 105/57 with HR 72 bpm.    Exercises General Exercises - Lower Extremity Ankle Circles/Pumps: Both, 10 reps, Supine, AROM Long Arc Quad: AROM, Both, 10 reps, Seated   Assessment/Plan    PT Assessment Patient needs continued PT services  PT Problem List Decreased balance;Decreased activity tolerance;Decreased mobility;Decreased knowledge of use of DME;Decreased safety awareness       PT Treatment Interventions DME instruction;Gait training;Functional mobility training;Therapeutic activities;Therapeutic exercise;Balance training;Patient/family education    PT Goals (Current goals can be found in the Care Plan section)  Acute Rehab PT Goals Patient Stated Goal: to go home PT Goal Formulation: With patient Time For Goal Achievement: 12/16/22 Potential to Achieve Goals: Good    Frequency Min 1X/week     Co-evaluation               AM-PAC PT "6 Clicks" Mobility  Outcome Measure Help needed turning from your back to your side while in a flat bed  without using bedrails?: None Help needed moving from lying on your back to sitting on the side of a flat bed without using bedrails?: A Little Help needed moving to and from a bed to a chair (including a wheelchair)?: A Little Help needed standing up from a chair using your arms (e.g., wheelchair or bedside chair)?: A Little Help needed to walk in hospital room?: A Little Help needed climbing 3-5 steps with a railing? : A Lot 6 Click Score: 18    End of Session Equipment Utilized During Treatment: Gait belt Activity Tolerance: Patient limited by fatigue Patient left: in chair;with call bell/phone within reach;with chair alarm set;with family/visitor present Nurse Communication: Mobility status PT Visit Diagnosis: Muscle weakness (generalized) (M62.81)    Time: 5784-6962 PT Time Calculation (min) (ACUTE ONLY): 31 min   Charges:   PT Evaluation $PT Eval Moderate Complexity: 1 Mod PT Treatments $Gait Training: 8-22 mins PT General Charges $$ ACUTE PT VISIT: 1 Visit         Jadasia Haws M,PT Acute Rehab Services [phone]   Bevelyn Buckles 12/02/2022, 1:54 PM

## 2022-12-02 NOTE — Progress Notes (Signed)
[facility] NEPHROLOGY PROGRESS NOTE  Assessment/ Plan: Pt is a 82 y.o. yo female with past medical history significant for hypertension, hearing loss, HLD, type II DM, presented to the ER on 11/27/2022 because of peripheral edema and worsening dyspnea, seen as a consultation for the evaluation of AKI.   # Acute kidney injury presumably cardiorenal syndrome with acute systolic CHF concomitant with persistent hypotension with reduced renal perfusion. UA bland, [place] normal. RHC with severely elevated biventricular filling pressure.   Increasing diuretics by cardiology.  Monitor urine output and renal panel.  Noted patient is followed by palliative care and she is DNR/DNI and leaning towards home with hospice.   # Hyponatremia, hypervolemic: Urine sodium high in the setting of Lasix over not reliable.  Continue diuretics.  I will add tolvaptan today.   # Metabolic acidosis: On sodium bicarbonate.  Follow labs.   # Acute hypoxic respiratory failure due to CHF: Diuresis now.  Currently on room air.   # Anemia: Hemoglobin around 9-10 mostly at baseline.  Currently on heparin.  Drop in hemoglobin noted, watch for any sign of bleeding.  Transfuse as needed.   # Hypotension: Persistent hypotension.  She is needing diuretics to manage volume.  Midodrine 10 mg 3 times daily.  #GOC: Palliative care is following and patient is DNR/DNI.  Continue current management.  I have discussed with the patient's 2 daughters today.  Subjective: Seen and examined at bedside.  Urine output is recorded only 1.5 L.  Seen by palliative care and patient is DNR/DNI.  The daughter was presented with her at the bedside.   Objective Vital signs in last 24 hours: Vitals:   12/01/22 1954 12/02/22 0350 12/02/22 0737 12/02/22 1130  BP: 102/60 (!) 93/56 (!) 94/58 (!) 101/58  Pulse: 73 66 70 66  Resp: 18 16 16 16   Temp: 97.7 F (36.5 C) 97.7 F (36.5 C) 98 F (36.7 C) (!) 97.4 F (36.3 C)  TempSrc: Oral  Oral Oral Oral  SpO2: 94% 92% 94% 95%  Weight:  75.4 kg    Height:       Weight change: 1.6 kg  Intake/Output Summary (Last 24 hours) at 12/02/2022 1322 Last data filed at 12/02/2022 1129 Gross per 24 hour  Intake 1659.43 ml  Output 925 ml  Net 734.43 ml       Labs: RENAL PANEL Recent Labs  Lab 11/27/22 1400 11/27/22 2303 11/30/22 0737 11/30/22 1528 11/30/22 1803 12/01/22 0658 12/01/22 1934 12/02/22 0548  NA 121*   < > 121* 124*  124* 123* 124* 121* 122*  K 4.8   < > 4.1 3.9  3.9 4.0 4.1 4.1 4.0  CL 93*   < > 92*  --  91* 92* 89* 90*  CO2 15*   < > 19*  --  19* 19* 20* 20*  GLUCOSE 160*   < > 127*  --  119* 114* 241* 165*  BUN 30*   < > 37*  --  36* 34* 40* 42*  CREATININE 1.27*   < > 1.75*  --  1.54* 1.44* 1.79* 1.83*  CALCIUM 8.6*   < > 8.5*  --  8.4* 8.1* 8.3* 8.3*  MG  --   --   --   --   --  1.7  --   --   ALBUMIN 3.5  --   --   --   --  2.7*  --   --    < > = values in  this interval not displayed.    Liver Function Tests: Recent Labs  Lab 11/27/22 1400 12/01/22 0658  AST 20  --   ALT 17  --   ALKPHOS 42  --   BILITOT 0.7  --   PROT 7.1  --   ALBUMIN 3.5 2.7*   No results for input(s): "LIPASE", "AMYLASE" in the last 168 hours. No results for input(s): "AMMONIA" in the last 168 hours. CBC: Recent Labs    11/29/22 0555 11/30/22 0737 11/30/22 1528 12/01/22 0658 12/02/22 0548  HGB 8.9* 8.9* 9.5*  9.5* 8.8* 7.8*  MCV 90.6 89.9  --  89.4 86.8    Cardiac Enzymes: No results for input(s): "CKTOTAL", "CKMB", "CKMBINDEX", "TROPONINI" in the last 168 hours. CBG: Recent Labs  Lab 12/01/22 1152 12/01/22 1627 12/01/22 2135 12/02/22 0735 12/02/22 1128  GLUCAP 172* 183* 221* 178* 213*    Iron Studies: No results for input(s): "IRON", "TIBC", "TRANSFERRIN", "FERRITIN" in the last 72 hours. Studies/Results: US RENAL  Result Date: 11/30/2022 CLINICAL DATA:  Acute renal injury EXAM: RENAL / URINARY TRACT ULTRASOUND COMPLETE COMPARISON:  None  Available. FINDINGS: Right Kidney: Renal measurements: 10.0 x 4.0 x 5.5 cm. = volume: 114 mL. Echogenicity within normal limits. No mass or hydronephrosis visualized. Left Kidney: Renal measurements: 9.3 x 4.6 x 4.2 cm. = volume: 93 mL. Echogenicity within normal limits. No mass or hydronephrosis visualized. Bladder: Decompressed Other: None. IMPRESSION: No acute abnormality noted. Electronically Signed   By: Alcide Clever M.D.   On: 11/30/2022 21:23   CARDIAC CATHETERIZATION  Result Date: 11/30/2022 HEMODYNAMICS: RA:       19 mmHg (mean) RV:       63/13, 21 mmHg PA:       63/35 mmHg (44 mean) PCWP: 34 mmHg (mean) with v waves to 50    Estimated Fick CO/CI   5.72L/min, 3.23L/min/m2    TPG  10  mmHg     PVR  1.7 Wood Units PAPi  1.47  IMPRESSION: Severely elevated left and right sided filling pressures. 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 Normal cardiac output and index by assumed fick method. SVR of 770 dynes/sec/cm-5. Severe coronary artery calcifications noted on fluoro. RECOMMENDATIONS: Would continue aggressive diuresis. RHC not consistent with low output heart failure. Vasodilatory state with low SVR.    Medications: Infusions:  amiodarone 30 mg/hr (12/02/22 0615)   furosemide      Scheduled Medications:  apixaban  2.5 mg Oral BID   aspirin EC  81 mg Oral Daily   Chlorhexidine Gluconate Cloth  6 each Topical Daily   furosemide  40 mg Intravenous Once   gabapentin  300 mg Oral TID   insulin aspart  0-15 Units Subcutaneous TID WC   insulin aspart  0-5 Units Subcutaneous QHS   midodrine  10 mg Oral TID WC   pantoprazole  40 mg Oral BID   simvastatin  40 mg Oral QPM   sodium bicarbonate  650 mg Oral BID   sodium chloride flush  10 mL Intravenous Q12H    have reviewed scheduled and prn medications.  Physical Exam: General:NAD, comfortable Heart:RRR, s1s2 nl Lungs:clear b/l, no crackle Abdomen:soft, Non-tender, non-distended Extremities:  Bilateral peripheral edema+ Neurology: Alert awake, hard of hearing.  Debra Grimes 12/02/2022,1:22 PM  LOS: 5 days

## 2022-12-02 NOTE — Progress Notes (Addendum)
Advanced Heart Failure Rounding Note  PCP-Cardiologist: None   Subjective:   Admitted with late presenting NSTEMI>acute systolic CHF  Diuresed yesterday with 80 lasix IV BID +500 diamox. -1.5L UOP. Weight up 4lbs?  SCr 1.54>1.44>1.79>1.83  Feels good. Daughter at bedside. Went over cath results with daughter.   Objective:   RHC 11/18 with severely elevated biventricular filling pressures, though cardiac output is reassuringly preserved. RA 19, PA 63/35 (44), PCWP 34 with v waves to 50, Fick CO/CI 5.72/3.23, PAPi 1.47  Weight Range: 75.4 kg Body mass index is 30.4 kg/m.   Vital Signs:   Temp:  [97.7 F (36.5 C)-98 F (36.7 C)] 98 F (36.7 C) (11/20 0737) Pulse Rate:  [66-97] 70 (11/20 0737) Resp:  [16-19] 16 (11/20 0737) BP: (86-102)/(51-60) 94/58 (11/20 0737) SpO2:  [92 %-95 %] 94 % (11/20 0737) Weight:  [75.4 kg] 75.4 kg (11/20 0350) Last BM Date : 12/01/22  Weight change: Filed Weights   11/30/22 0322 12/01/22 0345 12/02/22 0350  Weight: 75.6 kg 73.8 kg 75.4 kg    Intake/Output:   Intake/Output Summary (Last 24 hours) at 12/02/2022 0928 Last data filed at 12/02/2022 0508 Gross per 24 hour  Intake 1899.43 ml  Output 1025 ml  Net 874.43 ml      Physical Exam    General:  elderly appearing.  No respiratory difficulty HEENT: HOH Neck: supple. JVD elevated, large v waves to ear. Carotids 2+ bilat; no bruits. No lymphadenopathy or thyromegaly appreciated. Cor: PMI nondisplaced. Regular rate & rhythm. No rubs, gallops or murmurs. Lungs: clear Abdomen: soft, nontender, nondistended. No hepatosplenomegaly. No bruits or masses. Good bowel sounds. Extremities: no cyanosis, clubbing, rash, edema  Neuro: alert & oriented x 3, cranial nerves grossly intact. moves all 4 extremities w/o difficulty. Affect pleasant.   Telemetry   NSR 70s (Personally reviewed)    EKG    No new EKG to review  Labs    CBC Recent Labs    12/01/22 0658 12/02/22 0548  WBC  7.5 6.3  HGB 8.8* 7.8*  HCT 26.1* 23.0*  MCV 89.4 86.8  PLT 209 210   Basic Metabolic Panel Recent Labs    01/13/70 0658 12/01/22 1934 12/02/22 0548  NA 124* 121* 122*  K 4.1 4.1 4.0  CL 92* 89* 90*  CO2 19* 20* 20*  GLUCOSE 114* 241* 165*  BUN 34* 40* 42*  CREATININE 1.44* 1.79* 1.83*  CALCIUM 8.1* 8.3* 8.3*  MG 1.7  --   --    Liver Function Tests Recent Labs    12/01/22 0658  ALBUMIN 2.7*   No results for input(s): "LIPASE", "AMYLASE" in the last 72 hours. Cardiac Enzymes No results for input(s): "CKTOTAL", "CKMB", "CKMBINDEX", "TROPONINI" in the last 72 hours.  BNP: BNP (last 3 results) Recent Labs    11/27/22 1401  BNP 1,369.0*    ProBNP (last 3 results) No results for input(s): "PROBNP" in the last 8760 hours.   D-Dimer No results for input(s): "DDIMER" in the last 72 hours. Hemoglobin A1C No results for input(s): "HGBA1C" in the last 72 hours. Fasting Lipid Panel No results for input(s): "CHOL", "HDL", "LDLCALC", "TRIG", "CHOLHDL", "LDLDIRECT" in the last 72 hours. Thyroid Function Tests No results for input(s): "TSH", "T4TOTAL", "T3FREE", "THYROIDAB" in the last 72 hours.  Invalid input(s): "FREET3"  Other results:   Imaging    No results found.   Medications:     Scheduled Medications:  aspirin EC  81 mg Oral Daily  Chlorhexidine Gluconate Cloth  6 each Topical Daily   furosemide  80 mg Intravenous BID   gabapentin  300 mg Oral TID   insulin aspart  0-15 Units Subcutaneous TID WC   insulin aspart  0-5 Units Subcutaneous QHS   midodrine  10 mg Oral TID WC   pantoprazole  40 mg Oral BID   simvastatin  40 mg Oral QPM   sodium bicarbonate  650 mg Oral BID   sodium chloride flush  10 mL Intravenous Q12H    Infusions:  amiodarone 30 mg/hr (12/02/22 0615)   heparin 1,000 Units/hr (12/02/22 0429)    PRN Medications: acetaminophen **OR** acetaminophen, guaiFENesin-dextromethorphan, ondansetron **OR** ondansetron (ZOFRAN) IV,  polyethylene glycol, prochlorperazine  Patient Profile   82 y.o. female with no prior cardiac history. Known history of HTN, DM, HLD. Admitted with acute systolic CHF and NSTEMI.   Assessment/Plan  Acute systolic CHF -Suspect ischemic cardiomyopathy -Echo: EF 25-30%, RWMA, RV mildly reduced, moderate to severe MR -NYHA IV. Volume overloaded. Diuretics intermittently held d/t hypotension.   -RHC 11/18 with severely elevated biventricular filling pressures, though cardiac output is reassuringly preserved to definitively assess hemodynamics.  -May need inotrope support with milrinone +/-NE, would be palliative approach if she does not respond well to diuresis.  - Diuresed fairly well with 80 IV BID and diamox 500 mg x1.  Renal function stable. Increase lasix to 120 IV BID today. Transition to PO at discharge>Torsemide 60 mg BID - She is currently on midodrine 10 TID with SBP 90s-low 100s. - GDMT limited by hypotension/AKI   2. NSTEMI -Suspect late presentation MI -HS troponin ~ 1800 with flat trend -On simvastatin. Continue aspirin.  -On heparin gtt, stop today   3. Mitral regurgitation -Moderate to severe on echo -? Ischemic MR   4. AKI -Scr baseline uncertain, 1.27 on admit -Scr peaked at 1.75. Down trending with diuresis. 1.83 today -In setting of volume overload and suspected low-output HF -Nephrology following   5. DM: -A1c 6.8% - Per primary   6. Hypervolemic hyponatremia -Na 122 -Diurese aggressively   7. Atrial fibrillation -New onset 11/17 -Converted to SR with IV amiodarone -Continue amiodarone at 30/hr, can stop gtt at discharge - Stop hep gtt, with plans for home hospice no need to transition to eliquis   8. Anemia -Hgb 8s-9s. 7.8 today -Follow with anticoagulation   GOC:  -Palliative Care on board.  -Now DNR/DNI.  - Plan for home with hospice on Friday.   Stable from AHF stand point for home with hospice on Friday.   Would send home with palliative  diuresis: Torsemide 60 mg BID   Length of Stay: 5  Alen Bleacher, NP  12/02/2022, 9:28 AM  Advanced Heart Failure Team Pager [phone] (M-F; 7a - 5p)  Please contact [facility] for night-coverage after hours (5p -7a ) and weekends on [email or web address]

## 2022-12-02 NOTE — Progress Notes (Signed)
   Palliative Medicine Inpatient Follow Up Note  HPI: Debra Grimes is a 82 y.o. female with medical history significant of diabetes, hypertension, hyperlipidemia presents with several days of shortness of breath and generally feeling fairly poor, with bilateral lower extremity edema. She was admitted for NSTEMI and acute CHF. Palliative care consulted to support additional goals of care conversations.   Today's Discussion 12/02/2022  *Please note that this is a verbal dictation therefore any spelling or grammatical errors are due to the "[employer]agon [employer] One" system interpretation.  Chart reviewed inclusive of vital signs, progress notes, laboratory results, and diagnostic images.   I met at bedside with Debra Grimes who was receiving a bath by per nursing technician.  I spoke with patients daughter, Debra Grimes who shares that Debra Grimes has been more tired. She was able to ambulate to the door yesterday. We reviewed that Debra Grimes and her sister, Debra Bern do not desire for Debra Grimes to have a prolonged hospitalization is things are not getting better.   We discussed her acute on chronic conditions primarily focusing on her heart failure and severe MVR. WE discussed what to expect in both the short and long term. We reviewed the possible cycle of re-hospitalizations in the future. Patients daughter shares that she would not desire this. She goes on to say that Debra Grimes wants to be in her home and kept comfortable.  We discussed hospice. I described hospice as a service for patients who have a life expectancy of 6 months or less. The goal of hospice is the preservation of dignity and quality at the end phases of life. Under hospice care, the focus changes from curative to symptom relief.   Patients daughters feel that hospice is most aligned with patients goals.  We discussed medically optimizing the patient as best as possible and plan to transition home with hospice thereafter. Patients family share a preference  towards [facility].  Questions and concerns addressed/Palliative Support Provided.   Objective Assessment: Vital Signs Vitals:   12/02/22 0737 12/02/22 1130  BP: (!) 94/58 (!) 101/58  Pulse: 70 66  Resp: 16 16  Temp: 98 F (36.7 C) (!) 97.4 F (36.3 C)  SpO2: 94% 95%    Intake/Output Summary (Last 24 hours) at 12/02/2022 1216 Last data filed at 12/02/2022 1129 Gross per 24 hour  Intake 1899.43 ml  Output 1025 ml  Net 874.43 ml   Last Weight  Most recent update: 12/02/2022  4:52 AM    Weight  75.4 kg (166 lb 3.6 oz)            Gen:  Elderly Caucasian F in NAD HEENT: moist mucous membranes CV: regular rate and irregular rhythm  PULM:  On 2LPM [place], breathing is nonlabored ABD: soft/nontender/nondistended  EXT: (+)1 BLE  edema  Neuro: Alert and oriented x3 - hard of hearing  SUMMARY OF RECOMMENDATIONS   DNAR/DNI   Appreciate Cardiology medically optimizing  Plan for transition home with [facility] once stable   Ongoing incremental PMT support  Total Time: High ______________________________________________________________________________________ Lamarr Lulas [place] Palliative Medicine Team Team Cell Phone: [phone] Please utilize secure chat with additional questions, if there is no response within 30 minutes please call the above phone number  Palliative Medicine Team providers are available by phone from 7am to 7pm daily and can be reached through the team cell phone.  Should this patient require assistance outside of these hours, please call the patient's attending physician.

## 2022-12-02 NOTE — Progress Notes (Signed)
Debra Grimes  WGN:[id number] DOB: 09/15/1940 DOA: 11/27/2022 PCP: Ponciano Ort [facility]    Brief Narrative:  82 year old with a history of DM2, HTN, and HLD who presented to the hospital 11/15 with several days of progressively worsening shortness of breath bilateral lower extremity edema and generalized weakness.  She was found to be suffering with acute CHF and an NSTEMI.   Goals of Care:   Code Status: Limited: Do not attempt resuscitation (DNR) -DNR-LIMITED -Do Not Intubate/DNI    DVT prophylaxis: Eliquis  Interim Hx: Afebrile.  Blood pressure remains low with systolics 84-97.  Oxygen saturation 94% room air.  Resting comfortably at the time of my visit.  Assessment & Plan:  Acute systolic congestive heart failure / ischemic cardiomyopathy TTE 11/28/2022 noted EF 25-30% with regional WMA -goal is to focus on symptom management -medications being titrated  NSTEMI Was treated with heparin initially - cardiology following  Acute hypoxic respiratory failure due to pulmonary edema Has now been weaned off of oxygen with diuresis  Acute hypervolemic hyponatremia Due to CHF -sodium stable  Acute onset atrial fibrillation with RVR Now well-controlled with amiodarone  Acute kidney injury Felt to be due to cardiorenal syndrome -recheck in a.m. for prognostic purposes  Hyperkalemia Due to above -resolved with Lokelma  DM2 CBG reasonably controlled at present -A1c 6.8  Anemia of chronic disease Hemoglobin stable presently  History of HTN with now persistent hypotension Continue midodrine in hopes of improving renal perfusion  HLD Continue statin  Goals of care The goal is to hopefully improve her clinical state with conservative medical care, with the plan to return home with comfort focused care to include CHF symptom management  Family Communication: Spoke with daughter at bedside at length Disposition: Anticipate discharge home 11/22 with hospice  care   Objective: Blood pressure (!) 94/58, pulse 70, temperature 98 F (36.7 C), temperature source Oral, resp. rate 16, height 5\' 2"  (1.575 m), weight 75.4 kg, SpO2 94%.  Intake/Output Summary (Last 24 hours) at 12/02/2022 0933 Last data filed at 12/02/2022 7846 Gross per 24 hour  Intake 1899.43 ml  Output 1025 ml  Net 874.43 ml   Filed Weights   11/30/22 0322 12/01/22 0345 12/02/22 0350  Weight: 75.6 kg 73.8 kg 75.4 kg    Examination: General: No acute respiratory distress Lungs: Clear to auscultation bilaterally without wheezes or crackles Cardiovascular: Regular rate and rhythm without murmur gallop or rub normal S1 and S2 Abdomen: Nontender, nondistended, soft, bowel sounds positive, no rebound, no ascites, no appreciable mass Extremities: 1+ bilateral lower extremity edema  CBC: Recent Labs  Lab 11/30/22 0737 11/30/22 1528 12/01/22 0658 12/02/22 0548  WBC 8.6  --  7.5 6.3  HGB 8.9* 9.5*  9.5* 8.8* 7.8*  HCT 26.8* 28.0*  28.0* 26.1* 23.0*  MCV 89.9  --  89.4 86.8  PLT 236  --  209 210   Basic Metabolic Panel: Recent Labs  Lab 12/01/22 0658 12/01/22 1934 12/02/22 0548  NA 124* 121* 122*  K 4.1 4.1 4.0  CL 92* 89* 90*  CO2 19* 20* 20*  GLUCOSE 114* 241* 165*  BUN 34* 40* 42*  CREATININE 1.44* 1.79* 1.83*  CALCIUM 8.1* 8.3* 8.3*  MG 1.7  --   --    GFR: Estimated Creatinine Clearance: 22.1 mL/min (A) (by C-G formula based on SCr of 1.83 mg/dL (H)).   Scheduled Meds:  aspirin EC  81 mg Oral Daily   Chlorhexidine Gluconate Cloth  6 each Topical  Daily   furosemide  80 mg Intravenous BID   gabapentin  300 mg Oral TID   insulin aspart  0-15 Units Subcutaneous TID WC   insulin aspart  0-5 Units Subcutaneous QHS   midodrine  10 mg Oral TID WC   pantoprazole  40 mg Oral BID   simvastatin  40 mg Oral QPM   sodium bicarbonate  650 mg Oral BID   sodium chloride flush  10 mL Intravenous Q12H   Continuous Infusions:  amiodarone 30 mg/hr (12/02/22 0615)    heparin 1,000 Units/hr (12/02/22 0429)     LOS: 5 days   Lonia Blood, MD [facility] Office  [phone] Pager - Text Page per Loretha Stapler  If 7PM-7AM, please contact night-coverage per Amion 12/02/2022, 9:33 AM

## 2022-12-03 ENCOUNTER — Inpatient Hospital Stay (HOSPITAL_COMMUNITY): Payer: Medicare Other

## 2022-12-03 DIAGNOSIS — I214 Non-ST elevation (NSTEMI) myocardial infarction: Secondary | ICD-10-CM | POA: Diagnosis not present

## 2022-12-03 LAB — CBC
HCT: 23.9 % — ABNORMAL LOW (ref 36.0–46.0)
Hemoglobin: 7.9 g/dL — ABNORMAL LOW (ref 12.0–15.0)
MCH: 28.5 pg (ref 26.0–34.0)
MCHC: 33.1 g/dL (ref 30.0–36.0)
MCV: 86.3 fL (ref 80.0–100.0)
Platelets: 218 10*3/uL (ref 150–400)
RBC: 2.77 MIL/uL — ABNORMAL LOW (ref 3.87–5.11)
RDW: 14.4 % (ref 11.5–15.5)
WBC: 7.7 10*3/uL (ref 4.0–10.5)
nRBC: 0 % (ref 0.0–0.2)

## 2022-12-03 LAB — ALBUMIN: Albumin: 3.2 g/dL — ABNORMAL LOW (ref 3.5–5.0)

## 2022-12-03 LAB — BASIC METABOLIC PANEL
Anion gap: 13 (ref 5–15)
BUN: 46 mg/dL — ABNORMAL HIGH (ref 8–23)
CO2: 20 mmol/L — ABNORMAL LOW (ref 22–32)
Calcium: 8.4 mg/dL — ABNORMAL LOW (ref 8.9–10.3)
Chloride: 88 mmol/L — ABNORMAL LOW (ref 98–111)
Creatinine, Ser: 2.33 mg/dL — ABNORMAL HIGH (ref 0.44–1.00)
GFR, Estimated: 20 mL/min — ABNORMAL LOW (ref >60)
Glucose, Bld: 166 mg/dL — ABNORMAL HIGH (ref 70–99)
Potassium: 3.8 mmol/L (ref 3.5–5.1)
Sodium: 121 mmol/L — ABNORMAL LOW (ref 135–145)

## 2022-12-03 LAB — GLUCOSE, CAPILLARY
Glucose-Capillary: 165 mg/dL — ABNORMAL HIGH (ref 70–99)
Glucose-Capillary: 202 mg/dL — ABNORMAL HIGH (ref 70–99)
Glucose-Capillary: 184 mg/dL — ABNORMAL HIGH (ref 70–99)
Glucose-Capillary: 209 mg/dL — ABNORMAL HIGH (ref 70–99)

## 2022-12-03 MED ORDER — GABAPENTIN 100 MG PO CAPS
200.0000 mg | ORAL_CAPSULE | Freq: Three times a day (TID) | ORAL | Status: DC
  Administered 2022-12-03 – 2022-12-04 (×3): 200 mg via ORAL
  Filled 2022-12-03 (×3): qty 2

## 2022-12-03 MED ORDER — FUROSEMIDE 40 MG PO TABS
80.0000 mg | ORAL_TABLET | Freq: Two times a day (BID) | ORAL | Status: DC
  Administered 2022-12-03 – 2022-12-04 (×2): 80 mg via ORAL
  Filled 2022-12-03 (×2): qty 2

## 2022-12-03 MED ORDER — FUROSEMIDE 10 MG/ML IJ SOLN: 40.0000 mg | Freq: Once | INTRAMUSCULAR | Status: DC

## 2022-12-03 NOTE — Progress Notes (Signed)
[facility] NEPHROLOGY PROGRESS NOTE  Assessment/ Plan: Pt is a 82 y.o. yo female with past medical history significant for hypertension, hearing loss, HLD, type II DM, presented to the ER on 11/27/2022 because of peripheral edema and worsening dyspnea, seen as a consultation for the evaluation of AKI.   # Acute kidney injury presumably cardiorenal syndrome with acute systolic CHF concomitant with persistent hypotension with reduced renal perfusion. UA bland, [place] normal. RHC with severely elevated biventricular filling pressure.   Treated with IV Lasix and now switched to oral by cardiology.  Patient was seen by palliative care and now plan is to go home with hospice care.    # Hyponatremia, hypervolemic: Urine sodium high in the setting of Lasix over not reliable.  Treated with Lasix and a dose of tolvaptan on 11/20 without much improvement.  Holding further tolvaptan since she is going home with hospice.  It will not change long-term clinical course.    # Metabolic acidosis: On sodium bicarbonate.     # Acute hypoxic respiratory failure due to CHF: Diuresis now.  Currently on room air.   # Anemia: Hemoglobin around 9-10 mostly at baseline.  Currently on heparin.  Drop in hemoglobin noted, watch for any sign of bleeding.  Transfuse as needed.   # Hypotension: Persistent hypotension.  She is needing diuretics to manage volume.  Midodrine 10 mg 3 times daily.  #GOC: Palliative care is following and patient is DNR/DNI with plan to go home with hospice.  Nothing further to add, sign off, please call us back with question.  Discussed with the primary team.  Subjective: Seen and examined at bedside.  Urine output is recorded only 800 cc.  In room air.  She reports tired, BP remains low.  No major event  Objective Vital signs in last 24 hours: Vitals:   12/02/22 1950 12/03/22 0349 12/03/22 0418 12/03/22 0747  BP: (!) 99/58 (!) 94/51  (!) 87/53  Pulse: 68 60    Resp: 16 15  17    Temp: 97.8 F (36.6 C) 97.6 F (36.4 C)  (!) 97.3 F (36.3 C)  TempSrc: Oral Oral  Oral  SpO2: 96% 93%  94%  Weight:   75 kg   Height:       Weight change: -0.4 kg  Intake/Output Summary (Last 24 hours) at 12/03/2022 1116 Last data filed at 12/03/2022 0548 Gross per 24 hour  Intake 831.24 ml  Output 800 ml  Net 31.24 ml       Labs: RENAL PANEL Recent Labs  Lab 11/27/22 1400 11/27/22 2303 11/30/22 1803 12/01/22 0658 12/01/22 1934 12/02/22 0548 12/03/22 0418  NA 121*   < > 123* 124* 121* 122* 121*  K 4.8   < > 4.0 4.1 4.1 4.0 3.8  CL 93*   < > 91* 92* 89* 90* 88*  CO2 15*   < > 19* 19* 20* 20* 20*  GLUCOSE 160*   < > 119* 114* 241* 165* 166*  BUN 30*   < > 36* 34* 40* 42* 46*  CREATININE 1.27*   < > 1.54* 1.44* 1.79* 1.83* 2.33*  CALCIUM 8.6*   < > 8.4* 8.1* 8.3* 8.3* 8.4*  MG  --   --   --  1.7  --   --   --   ALBUMIN 3.5  --   --  2.7*  --   --  3.2*   < > = values in this interval not displayed.  Liver Function Tests: Recent Labs  Lab 11/27/22 1400 12/01/22 0658 12/03/22 0418  AST 20  --   --   ALT 17  --   --   ALKPHOS 42  --   --   BILITOT 0.7  --   --   PROT 7.1  --   --   ALBUMIN 3.5 2.7* 3.2*   No results for input(s): "LIPASE", "AMYLASE" in the last 168 hours. No results for input(s): "AMMONIA" in the last 168 hours. CBC: Recent Labs    11/30/22 0737 11/30/22 1528 12/01/22 0658 12/02/22 0548 12/03/22 0418  HGB 8.9* 9.5*  9.5* 8.8* 7.8* 7.9*  MCV 89.9  --  89.4 86.8 86.3    Cardiac Enzymes: No results for input(s): "CKTOTAL", "CKMB", "CKMBINDEX", "TROPONINI" in the last 168 hours. CBG: Recent Labs  Lab 12/02/22 0735 12/02/22 1128 12/02/22 1610 12/02/22 2137 12/03/22 0737  GLUCAP 178* 213* 189* 221* 165*    Iron Studies: No results for input(s): "IRON", "TIBC", "TRANSFERRIN", "FERRITIN" in the last 72 hours. Studies/Results: No results found.  Medications: Infusions:    Scheduled Medications:  aspirin EC  81 mg  Oral Daily   Chlorhexidine Gluconate Cloth  6 each Topical Daily   furosemide  80 mg Oral BID   gabapentin  200 mg Oral TID   insulin aspart  0-15 Units Subcutaneous TID WC   midodrine  10 mg Oral TID WC   pantoprazole  40 mg Oral BID   sodium bicarbonate  650 mg Oral BID   sodium chloride flush  10 mL Intravenous Q12H    have reviewed scheduled and prn medications.  Physical Exam: General:NAD, comfortable with intermittent somnolent. Heart:RRR, s1s2 nl Lungs:clear b/l, no crackle Abdomen:soft, Non-tender, non-distended Extremities: Bilateral peripheral edema+ Neurology: Alert awake, hard of hearing.  Philisha Weinel Prasad Kaniah Rizzolo 12/03/2022,11:16 AM  LOS: 6 days

## 2022-12-03 NOTE — Progress Notes (Signed)
Debra Grimes  QMV:[id number] DOB: 20-Jul-1940 DOA: 11/27/2022 PCP: Ponciano Ort [facility]    Brief Narrative:  82 year old with a history of DM2, HTN, and HLD who presented to the hospital 11/15 with several days of progressively worsening shortness of breath bilateral lower extremity edema and generalized weakness.  She was found to be suffering with acute CHF and an NSTEMI.   Goals of Care:   Code Status: Limited: Do not attempt resuscitation (DNR) -DNR-LIMITED -Do Not Intubate/DNI    DVT prophylaxis: Eliquis  Interim Hx: No acute events recorded overnight.  Afebrile.  Blood pressure modestly low with systolics 87-99.  Saturation 95% room air.  Alert and conversant at the time of my exam.  Appears comfortable.  Denies any complaints.  States she is looking forward to going home tomorrow.  Assessment & Plan:  Acute systolic congestive heart failure / ischemic cardiomyopathy TTE 11/28/2022 noted EF 25-30% with regional WMA - goal is to focus on symptom management - medications being titrated to assure comfort in regard to volume status  NSTEMI Was treated with heparin initially - cardiology has completed the evaluation -acute intervention not appropriate  Acute hypoxic respiratory failure due to pulmonary edema Has now been weaned off of oxygen with diuresis -stable  Acute hypervolemic hyponatremia Due to CHF - sodium stable  Acute onset atrial fibrillation with RVR Now in NSR -discontinue amiodarone today  Acute kidney injury Felt to be due to cardiorenal syndrome -creatinine slowly worsening with ongoing diuresis  Hyperkalemia Due to above -resolved with Lokelma  DM2 CBG reasonably controlled at present - A1c 6.8 -no indication for strict CBG control at this time  Anemia of chronic disease Hemoglobin stable presently  History of HTN with now persistent hypotension Continue midodrine in hopes of improving renal perfusion  HLD No long-term benefit to be gained by  continuing statin at this time  Goals of care the plan is to return home with comfort focused care 11/22   Family Communication:  Disposition: Anticipate discharge home 11/22 with hospice care   Objective: Blood pressure (!) 87/53, pulse 60, temperature (!) 97.3 F (36.3 C), temperature source Oral, resp. rate 17, height 5\' 2"  (1.575 m), weight 75 kg, SpO2 94%.  Intake/Output Summary (Last 24 hours) at 12/03/2022 0926 Last data filed at 12/03/2022 0548 Gross per 24 hour  Intake 831.24 ml  Output 800 ml  Net 31.24 ml   Filed Weights   12/01/22 0345 12/02/22 0350 12/03/22 0418  Weight: 73.8 kg 75.4 kg 75 kg    Examination: General: No acute respiratory distress Lungs: Clear to auscultation bilaterally  Cardiovascular: Regular rate and rhythm without murmur  Abdomen: Nontender, nondistended, soft, bowel sounds positive Extremities: 1+ bilateral lower extremity edema without significant change  CBC: Recent Labs  Lab 12/01/22 0658 12/02/22 0548 12/03/22 0418  WBC 7.5 6.3 7.7  HGB 8.8* 7.8* 7.9*  HCT 26.1* 23.0* 23.9*  MCV 89.4 86.8 86.3  PLT 209 210 218   Basic Metabolic Panel: Recent Labs  Lab 12/01/22 0658 12/01/22 1934 12/02/22 0548 12/03/22 0418  NA 124* 121* 122* 121*  K 4.1 4.1 4.0 3.8  CL 92* 89* 90* 88*  CO2 19* 20* 20* 20*  GLUCOSE 114* 241* 165* 166*  BUN 34* 40* 42* 46*  CREATININE 1.44* 1.79* 1.83* 2.33*  CALCIUM 8.1* 8.3* 8.3* 8.4*  MG 1.7  --   --   --    GFR: Estimated Creatinine Clearance: 17.4 mL/min (A) (by C-G formula based on  SCr of 2.33 mg/dL (H)).   Scheduled Meds:  aspirin EC  81 mg Oral Daily   Chlorhexidine Gluconate Cloth  6 each Topical Daily   gabapentin  300 mg Oral TID   insulin aspart  0-15 Units Subcutaneous TID WC   insulin aspart  0-5 Units Subcutaneous QHS   midodrine  10 mg Oral TID WC   pantoprazole  40 mg Oral BID   simvastatin  40 mg Oral QPM   sodium bicarbonate  650 mg Oral BID   sodium chloride flush  10  mL Intravenous Q12H   Continuous Infusions:  amiodarone 30 mg/hr (12/03/22 0548)   furosemide Stopped (12/02/22 2044)     LOS: 6 days   Lonia Blood, MD [facility] Office  [phone] Pager - Text Page per Loretha Stapler  If 7PM-7AM, please contact night-coverage per Amion 12/03/2022, 9:26 AM

## 2022-12-03 NOTE — Plan of Care (Signed)
  Problem: Education: Goal: Knowledge of General Education information will improve Description: Including pain rating scale, medication(s)/side effects and non-pharmacologic comfort measures Outcome: Progressing   Problem: Health Behavior/Discharge Planning: Goal: Ability to manage health-related needs will improve Outcome: Progressing   Problem: Clinical Measurements: Goal: Ability to maintain clinical measurements within normal limits will improve Outcome: Progressing Goal: Will remain free from infection Outcome: Progressing Goal: Diagnostic test results will improve Outcome: Progressing Goal: Respiratory complications will improve Outcome: Progressing Goal: Cardiovascular complication will be avoided Outcome: Progressing   Problem: Activity: Goal: Risk for activity intolerance will decrease Outcome: Progressing   Problem: Nutrition: Goal: Adequate nutrition will be maintained Outcome: Progressing   Problem: Coping: Goal: Level of anxiety will decrease Outcome: Progressing   Problem: Elimination: Goal: Will not experience complications related to bowel motility Outcome: Progressing Goal: Will not experience complications related to urinary retention Outcome: Progressing   Problem: Pain Management: Goal: General experience of comfort will improve Outcome: Progressing   Problem: Safety: Goal: Ability to remain free from injury will improve Outcome: Progressing   Problem: Skin Integrity: Goal: Risk for impaired skin integrity will decrease Outcome: Progressing   Problem: Education: Goal: Ability to demonstrate management of disease process will improve Outcome: Progressing Goal: Ability to verbalize understanding of medication therapies will improve Outcome: Progressing Goal: Individualized Educational Video(s) Outcome: Progressing   Problem: Activity: Goal: Capacity to carry out activities will improve Outcome: Progressing   Problem: Cardiac: Goal:  Ability to achieve and maintain adequate cardiopulmonary perfusion will improve Outcome: Progressing   Problem: Education: Goal: Ability to describe self-care measures that may prevent or decrease complications (Diabetes Survival Skills Education) will improve Outcome: Progressing Goal: Individualized Educational Video(s) Outcome: Progressing   Problem: Coping: Goal: Ability to adjust to condition or change in health will improve Outcome: Progressing   Problem: Fluid Volume: Goal: Ability to maintain a balanced intake and output will improve Outcome: Progressing   Problem: Health Behavior/Discharge Planning: Goal: Ability to identify and utilize available resources and services will improve Outcome: Progressing Goal: Ability to manage health-related needs will improve Outcome: Progressing   Problem: Metabolic: Goal: Ability to maintain appropriate glucose levels will improve Outcome: Progressing   Problem: Nutritional: Goal: Maintenance of adequate nutrition will improve Outcome: Progressing Goal: Progress toward achieving an optimal weight will improve Outcome: Progressing   Problem: Skin Integrity: Goal: Risk for impaired skin integrity will decrease Outcome: Progressing   Problem: Tissue Perfusion: Goal: Adequacy of tissue perfusion will improve Outcome: Progressing   Problem: Education: Goal: Understanding of CV disease, CV risk reduction, and recovery process will improve Outcome: Progressing Goal: Individualized Educational Video(s) Outcome: Progressing   Problem: Activity: Goal: Ability to return to baseline activity level will improve Outcome: Progressing   Problem: Cardiovascular: Goal: Ability to achieve and maintain adequate cardiovascular perfusion will improve Outcome: Progressing Goal: Vascular access site(s) Level 0-1 will be maintained Outcome: Progressing   Problem: Health Behavior/Discharge Planning: Goal: Ability to safely manage  health-related needs after discharge will improve Outcome: Progressing

## 2022-12-03 NOTE — Progress Notes (Signed)
Reviewed HF recommendations. Pt to go home with hospice.  D/w primary service. Cardiology will follow peripherally until patient is discharged.   Parke Poisson, MD

## 2022-12-03 NOTE — Plan of Care (Signed)
Will continue to monitor patient.

## 2022-12-03 NOTE — Progress Notes (Signed)
Mobility Specialist Progress Note:   12/03/22 1048  Mobility  Activity Ambulated with assistance in hallway  Level of Assistance Contact guard assist, steadying assist  Assistive Device Four wheel walker  Distance Ambulated (ft) 70 ft  Activity Response Tolerated well  Mobility Referral Yes  $Mobility charge 1 Mobility  Mobility Specialist Start Time (ACUTE ONLY) 1048  Mobility Specialist Stop Time (ACUTE ONLY) 1105  Mobility Specialist Time Calculation (min) (ACUTE ONLY) 17 min   Pt agreeable to mobility session. Required only minG assist throughout with heavy cues on safe rollator use. Required x1 seated rest break, for no specified reason. Pt requesting to return to room after rest. Declined sitting in chair however conversation difficult d/t HOH. Pt left in bed with all needs met, alarm on.   Addison Lank Mobility Specialist Please contact via SecureChat or  Rehab office at [phone]

## 2022-12-04 ENCOUNTER — Other Ambulatory Visit (HOSPITAL_COMMUNITY): Payer: Self-pay

## 2022-12-04 DIAGNOSIS — I214 Non-ST elevation (NSTEMI) myocardial infarction: Secondary | ICD-10-CM | POA: Diagnosis not present

## 2022-12-04 LAB — BASIC METABOLIC PANEL
Anion gap: 17 — ABNORMAL HIGH (ref 5–15)
BUN: 52 mg/dL — ABNORMAL HIGH (ref 8–23)
CO2: 18 mmol/L — ABNORMAL LOW (ref 22–32)
Calcium: 8.7 mg/dL — ABNORMAL LOW (ref 8.9–10.3)
Chloride: 89 mmol/L — ABNORMAL LOW (ref 98–111)
Creatinine, Ser: 2.47 mg/dL — ABNORMAL HIGH (ref 0.44–1.00)
GFR, Estimated: 19 mL/min — ABNORMAL LOW (ref >60)
Glucose, Bld: 235 mg/dL — ABNORMAL HIGH (ref 70–99)
Potassium: 3.9 mmol/L (ref 3.5–5.1)
Sodium: 124 mmol/L — ABNORMAL LOW (ref 135–145)

## 2022-12-04 LAB — URINE CULTURE: Culture: 30000 — AB

## 2022-12-04 LAB — GLUCOSE, CAPILLARY: Glucose-Capillary: 237 mg/dL — ABNORMAL HIGH (ref 70–99)

## 2022-12-04 MED ORDER — ORAL CARE MOUTH RINSE: 15.0000 mL | OROMUCOSAL | Status: DC | PRN

## 2022-12-04 MED ORDER — FUROSEMIDE 80 MG PO TABS
80.0000 mg | ORAL_TABLET | Freq: Two times a day (BID) | ORAL | 2 refills | Status: DC
  Filled 2022-12-04: qty 60, 30d supply, fill #0

## 2022-12-04 MED ORDER — ACETAMINOPHEN 325 MG PO TABS: 650.0000 mg | ORAL_TABLET | Freq: Four times a day (QID) | ORAL | Status: DC | PRN

## 2022-12-04 MED ORDER — LORAZEPAM 1 MG PO TABS
1.0000 mg | ORAL_TABLET | ORAL | 0 refills | Status: DC | PRN
  Filled 2022-12-04: qty 30, 10d supply, fill #0

## 2022-12-04 MED ORDER — LORAZEPAM 2 MG/ML IJ SOLN: 1.0000 mg | INTRAMUSCULAR | Status: DC | PRN

## 2022-12-04 MED ORDER — POLYETHYLENE GLYCOL 3350 17 GM/SCOOP PO POWD
17.0000 g | Freq: Every day | ORAL | 0 refills | Status: DC | PRN
  Filled 2022-12-04: qty 238, 14d supply, fill #0

## 2022-12-04 MED ORDER — GABAPENTIN 100 MG PO CAPS
200.0000 mg | ORAL_CAPSULE | Freq: Three times a day (TID) | ORAL | 2 refills | Status: DC
  Filled 2022-12-04: qty 180, 30d supply, fill #0

## 2022-12-04 MED ORDER — MORPHINE SULFATE (CONCENTRATE) 10 MG /0.5 ML PO SOLN
5.0000 mg | ORAL | Status: DC | PRN
Start: 2022-12-04 — End: 2022-12-04

## 2022-12-04 MED ORDER — MORPHINE SULFATE (CONCENTRATE) 10 MG /0.5 ML PO SOLN: 5.0000 mg | ORAL | Status: DC | PRN

## 2022-12-04 MED ORDER — LORAZEPAM 1 MG PO TABS: 1.0000 mg | ORAL_TABLET | ORAL | Status: DC | PRN

## 2022-12-04 MED ORDER — MIDODRINE HCL 10 MG PO TABS
10.0000 mg | ORAL_TABLET | Freq: Three times a day (TID) | ORAL | 2 refills | Status: DC
  Filled 2022-12-04: qty 90, 30d supply, fill #0

## 2022-12-04 MED ORDER — MORPHINE SULFATE (CONCENTRATE) 10 MG /0.5 ML PO SOLN
5.0000 mg | ORAL | 0 refills | Status: DC | PRN
  Filled 2022-12-04: qty 15, 3d supply, fill #0

## 2022-12-04 MED ORDER — ONDANSETRON HCL 4 MG PO TABS
4.0000 mg | ORAL_TABLET | Freq: Four times a day (QID) | ORAL | 0 refills | Status: DC | PRN
  Filled 2022-12-04: qty 20, 5d supply, fill #0

## 2022-12-04 MED ORDER — LORAZEPAM 2 MG/ML PO CONC: 1.0000 mg | ORAL | Status: DC | PRN

## 2022-12-04 NOTE — Inpatient Diabetes Management (Signed)
Inpatient Diabetes Program Recommendations  AACE/ADA: New Consensus Statement on Inpatient Glycemic Control (2015)  Target Ranges:  Prepandial:   less than 140 mg/dL      Peak postprandial:   less than 180 mg/dL (1-2 hours)      Critically ill patients:  140 - 180 mg/dL   Lab Results  Component Value Date   GLUCAP 237 (H) 12/04/2022   HGBA1C 6.8 (H) 11/27/2022    Review of Glycemic Control  Latest Reference Range & Units 12/03/22 11:46 12/03/22 16:29 12/03/22 21:16 12/04/22 07:34  Glucose-Capillary 70 - 99 mg/dL 295 (H) 284 (H) 132 (H) 237 (H)  (H): Data is abnormally high Diabetes history: Type 2 DM Outpatient Diabetes medications: Metformin 1000 mg BID, Amaryl 4 mg QD Current orders for Inpatient glycemic control: Novolog 0-15 units TID  Inpatient Diabetes Program Recommendations:    Consider adding Semglee 8 units every day if appropriate.   Thanks, Lujean Rave, MSN, RNC-OB Diabetes Coordinator [phone] (8a-5p)

## 2022-12-04 NOTE — Discharge Summary (Signed)
DISCHARGE SUMMARY  Debra Grimes  MR#: [id number]  DOB:05-Apr-1940  Date of Admission: 11/27/2022 Date of Discharge: 12/04/2022  Attending Physician:Natalina Wieting Silvestre Gunner, MD  Patient's WUJ:WJXB, [facility]  Disposition: D/C home   Follow-up Appts:  Follow-up Information     [place] Follow up.   Why: [facility]osp[facility] RN will call to arrange admission visit Contact information: [address] [place] [address] [phone]         Pll[facility], [facility] Follow up.   Why: As needed Contact information: [address] [place] [address] [phone]                 Discharge Diagnoses: Acute systolic congestive heart failure / ischemic cardiomyopathy NSTEMI Acute hypoxic respiratory failure due to pulmonary edema Acute hypervolemic hyponatremia Acute onset atrial fibrillation with RVR Acute kidney injury Hperkalemia DM2 Anemia of chronic disease History of HTN with now persistent hypotension HLD Goals of care planning    Initial presentation: 82 year old with a history of DM2, HTN, and HLD who presented to the hospital 11/15 with several days of progressively worsening shortness of breath bilateral lower extremity edema and generalized weakness. She was found to be suffering with acute CHF and an NSTEMI.   Hospital Course:  Acute systolic congestive heart failure / ischemic cardiomyopathy TTE 11/28/2022 noted EF 25-30% with regional WMA - goal is to focus on symptom management - medications being titrated to assure comfort in regard to volume status - Hospice to follow patient at home - morphine and ativan provided at discharge for rescue medications to use at home for episodes of severe dyspnea    NSTEMI Was treated with heparin initially - cardiology completed an evaluation -acute intervention not appropriate   Acute hypoxic respiratory failure due to pulmonary edema Has now been weaned off of  oxygen with diuresis - stable   Acute hypervolemic hyponatremia Due to CHF - sodium stable   Acute onset atrial fibrillation with RVR Now in NSR - discontinued amiodarone prior to d/c home    Acute kidney injury Felt to be due to cardiorenal syndrome -creatinine slowly worsening with ongoing diuresis - no indication for further monitoring, as focus is no treatment of sx alone    Hyperkalemia Due to above -resolved with Lokelma   DM2 CBG reasonably controlled at present - A1c 6.8 -no indication for strict CBG control at this time - resume glucophage alone at time of d/c, with low threshold to stop that if intake poor    Anemia of chronic disease Hemoglobin stable presently   History of HTN with now persistent hypotension Continue midodrine in hopes of improving renal perfusion   HLD No long-term benefit to be gained by continuing statin at this time   Goals of care return home with comfort focused care 11/22 - NCB/DNR  Allergies as of 12/04/2022       Reactions   Hydrocodone    Oxycodone-acetaminophen    Penicillins         Medication List     STOP taking these medications    amLODipine 5 MG tablet Commonly known as: NORVASC   CITRACAL + D PO   glimepiride 4 MG tablet Commonly known as: AMARYL   losartan 100 MG tablet Commonly known as: COZAAR   risedronate 150 MG tablet Commonly known as: ACTONEL   simvastatin 40 MG tablet Commonly known as: ZOCOR       TAKE these medications  acetaminophen 325 MG tablet Commonly known as: TYLENOL Take 2 tablets (650 mg total) by mouth every 6 (six) hours as needed for mild pain (pain score 1-3), fever or headache.   furosemide 80 MG tablet Commonly known as: LASIX Take 1 tablet (80 mg total) by mouth 2 (two) times daily.   gabapentin 100 MG capsule Commonly known as: NEURONTIN Take 2 capsules (200 mg total) by mouth 3 (three) times daily. What changed:  medication strength how much to take    LORazepam 2 MG/ML concentrated solution Commonly known as: ATIVAN Place 0.5 mLs (1 mg total) under the tongue every 4 (four) hours as needed for anxiety.   metFORMIN 1000 MG tablet Commonly known as: GLUCOPHAGE Take 1,000 mg by mouth 2 (two) times daily with a meal.   midodrine 10 MG tablet Commonly known as: PROAMATINE Take 1 tablet (10 mg total) by mouth 3 (three) times daily with meals.   morphine CONCENTRATE 10 mg / 0.5 ml concentrated solution Place 0.25 mLs (5 mg total) under the tongue every 2 (two) hours as needed for moderate pain (pain score 4-6) (or dyspnea).   ondansetron 4 MG tablet Commonly known as: ZOFRAN Take 1 tablet (4 mg total) by mouth every 6 (six) hours as needed for nausea.   polyethylene glycol 17 g packet Commonly known as: MIRALAX / GLYCOLAX Take 17 g by mouth daily as needed for mild constipation.        Day of Discharge BP (!) 97/56 (BP Location: Left Arm)   Pulse 72   Temp (!) 97.4 F (36.3 C) (Axillary)   Resp 19   Ht 5\' 2"  (1.575 m)   Wt 73.6 kg   SpO2 97%   BMI 29.68 kg/m   Physical Exam: General: No acute respiratory distress Lungs: Clear to auscultation bilaterally without wheezes or crackles Cardiovascular: Regular rate and rhythm without murmur gallop or rub normal S1 and S2 Abdomen: Nontender, nondistended, soft, bowel sounds positive, no rebound, no ascites, no appreciable mass Extremities: No significant cyanosis, clubbing, or edema bilateral lower extremities  Basic Metabolic Panel: Recent Labs  Lab 12/01/22 0658 12/01/22 1934 12/02/22 0548 12/03/22 0418 12/04/22 0546  NA 124* 121* 122* 121* 124*  K 4.1 4.1 4.0 3.8 3.9  CL 92* 89* 90* 88* 89*  CO2 19* 20* 20* 20* 18*  GLUCOSE 114* 241* 165* 166* 235*  BUN 34* 40* 42* 46* 52*  CREATININE 1.44* 1.79* 1.83* 2.33* 2.47*  CALCIUM 8.1* 8.3* 8.3* 8.4* 8.7*  MG 1.7  --   --   --   --     CBC: Recent Labs  Lab 11/29/22 0555 11/30/22 0737 11/30/22 1528  12/01/22 0658 12/02/22 0548 12/03/22 0418  WBC 8.3 8.6  --  7.5 6.3 7.7  HGB 8.9* 8.9* 9.5*  9.5* 8.8* 7.8* 7.9*  HCT 26.9* 26.8* 28.0*  28.0* 26.1* 23.0* 23.9*  MCV 90.6 89.9  --  89.4 86.8 86.3  PLT 230 236  --  209 210 218    Time spent in discharge (includes decision making & examination of pt): 35 minutes  12/04/2022, 10:57 AM   Lonia Blood, MD [facility] Office  [phone]

## 2022-12-04 NOTE — Progress Notes (Signed)
Physical Therapy Treatment Patient Details Name: Debra Grimes MRN: [id number] DOB: 08/25/1940 Today's Date: 12/04/2022   History of Present Illness 82 y.o. female admitted 11/15 with several days of shortness of breath with bilateral lower extremity edema. She was admitted for NSTEMI and acute CHF.  PMH: diabetes, hypertension, hyperlipidemia    PT Comments  Pt was able to progress to performing transfers and ambulating in the halls with a rollator with CGA for safety only. She did require x1 seated rest break due to fatigue after ambulating ~150 ft though. Pt attempted to follow cues to improve her posture and her bil feet clearance and step length when ambulating. Will continue to follow acutely.    If plan is discharge home, recommend the following: A little help with walking and/or transfers;A little help with bathing/dressing/bathroom;Assistance with cooking/housework;Assist for transportation;Help with stairs or ramp for entrance   Can travel by private vehicle        Equipment Recommendations  Rollator (4 wheels);BSC/3in1    Recommendations for Other Services       Precautions / Restrictions Precautions Precautions: Fall Precaution Comments: Watch HR and BP Restrictions Weight Bearing Restrictions: No     Mobility  Bed Mobility               General bed mobility comments: Pt sitting in recliner at start and end of session    Transfers Overall transfer level: Needs assistance Equipment used: Rollator (4 wheels) Transfers: Sit to/from Stand Sit to Stand: Contact guard assist           General transfer comment: Pt required extra time to power up to stand and extend trunk, transferred to stand from recliner 1x and from rollator seat 1x. Needed cues and assistance for locking/unlocking rollator brakes pre/post transfers.    Ambulation/Gait Ambulation/Gait assistance: Contact guard assist Gait Distance (Feet): 150 Feet (x2 bouts of ~150 ft > ~90  ft) Assistive device: Rollator (4 wheels) Gait Pattern/deviations: Step-through pattern, Decreased stride length, Trunk flexed, Decreased dorsiflexion - right, Decreased dorsiflexion - left Gait velocity: reduced Gait velocity interpretation: <1.31 ft/sec, indicative of household ambulator   General Gait Details: Provided tactile cues to improve upright posture and verbal and visual cues to improve bil feet clearance and step length, mod success noted. No LOB, CGA for safety   Stairs             Wheelchair Mobility     Tilt Bed    Modified Rankin (Stroke Patients Only)       Balance Overall balance assessment: Needs assistance Sitting-balance support: Feet supported Sitting balance-Leahy Scale: Good     Standing balance support: Reliant on assistive device for balance Standing balance-Leahy Scale: Fair Standing balance comment: can static stand without RW but does better with UE support                            Cognition Arousal: Alert Behavior During Therapy: WFL for tasks assessed/performed Overall Cognitive Status: Within Functional Limits for tasks assessed                                 General Comments: Pt HOH        Exercises      General Comments General comments (skin integrity, edema, etc.): BP 98/53 (68) sitting start of session, 95/62 (72) standing, 98/64 (76) while ambulating prior to sitting to rest  after first bout, 88/49 (62) sitting in chair end of session - pt denied lightheadedness throughout      Pertinent Vitals/Pain Pain Assessment Pain Assessment: Faces Faces Pain Scale: No hurt Pain Intervention(s): Monitored during session    Home Living                          Prior Function            PT Goals (current goals can now be found in the care plan section) Acute Rehab PT Goals Patient Stated Goal: to go home PT Goal Formulation: With patient Time For Goal Achievement:  12/16/22 Potential to Achieve Goals: Good Progress towards PT goals: Progressing toward goals    Frequency    Min 1X/week      PT Plan      Co-evaluation              AM-PAC PT "6 Clicks" Mobility   Outcome Measure  Help needed turning from your back to your side while in a flat bed without using bedrails?: None Help needed moving from lying on your back to sitting on the side of a flat bed without using bedrails?: A Little Help needed moving to and from a bed to a chair (including a wheelchair)?: A Little Help needed standing up from a chair using your arms (e.g., wheelchair or bedside chair)?: A Little Help needed to walk in hospital room?: A Little Help needed climbing 3-5 steps with a railing? : A Lot 6 Click Score: 18    End of Session Equipment Utilized During Treatment: Gait belt Activity Tolerance: Patient limited by fatigue Patient left: in chair;with call bell/phone within reach (no chair alarm on upon arrival and did not see one in room)   PT Visit Diagnosis: Muscle weakness (generalized) (M62.81);Unsteadiness on feet (R26.81);Other abnormalities of gait and mobility (R26.89)     Time: 1610-9604 PT Time Calculation (min) (ACUTE ONLY): 16 min  Charges:    $Gait Training: 8-22 mins PT General Charges $$ ACUTE PT VISIT: 1 Visit                     Virgil Benedict, PT, DPT Acute Rehabilitation Services  Office: [phone]    Bettina Gavia 12/04/2022, 10:18 AM

## 2022-12-04 NOTE — Plan of Care (Signed)
  Problem: Clinical Measurements: Goal: Respiratory complications will improve Outcome: Progressing Goal: Cardiovascular complication will be avoided Outcome: Progressing   Problem: Nutrition: Goal: Adequate nutrition will be maintained Outcome: Progressing   Problem: Safety: Goal: Ability to remain free from injury will improve Outcome: Progressing   Problem: Cardiac: Goal: Ability to achieve and maintain adequate cardiopulmonary perfusion will improve Outcome: Progressing

## 2022-12-04 NOTE — TOC Progression Note (Addendum)
Transition of Care [facility]) - Progression Note    Patient Details  Name: Debra Grimes MRN: [id number] Date of Birth: Feb 14, 1940  [facility] CM/SW Contact  Elliot Cousin, RN Phone Number: [phone] 12/04/2022, 9:27 AM  Clinical Narrative:   CM received call from [facility] will deliver DME today to home.  Spoke to dtr and she will pick up the portable oxygen from [place] for transport home. Message sent to attending.   DNR form on chart for MD to sign.     Expected Discharge Plan: Home w Hospice Care Barriers to Discharge: No Barriers Identified  Expected Discharge Plan and Services   Discharge Planning Services: CM Consult Post Acute Care Choice: Hospice Living arrangements for the past 2 months: Single Family Home                 DME Arranged: Hospice Equipment Package A DME Agency: [facility]       HH Arranged: RN [facility] Agency: [facility] ([facility] Exon) Date HH Agency Contacted: 12/04/22 Time HH Agency Contacted: [phone] Representative spoke with at [facility] Agency: Myna Hidalgo   Social Determinants of Health (SDOH) Interventions SDOH Screenings   Food Insecurity: No Food Insecurity (11/27/2022)  Housing: Low Risk  (11/27/2022)  Transportation Needs: No Transportation Needs (11/27/2022)  Utilities: Not At Risk (11/27/2022)  Tobacco Use: Low Risk  (11/27/2022)    Readmission Risk Interventions     No data to display

## 2023-01-13 DEATH — deceased

## 2023-04-29 ENCOUNTER — Other Ambulatory Visit: Payer: Self-pay
